# Patient Record
Sex: Female | Born: 2000 | Race: White | Hispanic: No | Marital: Single | Smoking: Never smoker
Health system: Southern US, Community
[De-identification: ages and names within clinical notes are randomized; demographics above are authoritative.]

## PROBLEM LIST (undated history)

## (undated) DIAGNOSIS — F32A Depression, unspecified: Secondary | ICD-10-CM

## (undated) DIAGNOSIS — F419 Anxiety disorder, unspecified: Secondary | ICD-10-CM

## (undated) DIAGNOSIS — F329 Major depressive disorder, single episode, unspecified: Secondary | ICD-10-CM

## (undated) DIAGNOSIS — G35 Multiple sclerosis: Secondary | ICD-10-CM

## (undated) DIAGNOSIS — G35D Multiple sclerosis, unspecified: Secondary | ICD-10-CM

## (undated) HISTORY — DX: Depression, unspecified: F32.A

## (undated) HISTORY — DX: Anxiety disorder, unspecified: F41.9

## (undated) HISTORY — DX: Multiple sclerosis, unspecified: G35.D

## (undated) HISTORY — DX: Multiple sclerosis: G35

---
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
## 2020-01-11 ENCOUNTER — Other Ambulatory Visit: Payer: Self-pay

## 2020-01-11 ENCOUNTER — Encounter: Payer: Self-pay | Admitting: Family Medicine

## 2020-01-11 ENCOUNTER — Ambulatory Visit: Payer: BC Managed Care – PPO | Admitting: Family Medicine

## 2020-01-11 VITALS — BP 133/88 | HR 84 | Temp 97.8°F | Ht 66.0 in | Wt 289.0 lb

## 2020-01-11 DIAGNOSIS — Z7289 Other problems related to lifestyle: Secondary | ICD-10-CM | POA: Insufficient documentation

## 2020-01-11 DIAGNOSIS — F329 Major depressive disorder, single episode, unspecified: Secondary | ICD-10-CM | POA: Diagnosis not present

## 2020-01-11 DIAGNOSIS — F411 Generalized anxiety disorder: Secondary | ICD-10-CM | POA: Diagnosis not present

## 2020-01-11 DIAGNOSIS — F32A Depression, unspecified: Secondary | ICD-10-CM

## 2020-01-11 DIAGNOSIS — F66 Other sexual disorders: Secondary | ICD-10-CM | POA: Diagnosis not present

## 2020-01-11 MED ORDER — BUSPIRONE HCL 10 MG PO TABS: 10.0000 mg | ORAL_TABLET | Freq: Two times a day (BID) | ORAL | 2 refills | Status: DC

## 2020-01-11 NOTE — Progress Notes (Signed)
Subjective: CC: f/u GAD PCP: Raliegh Ip, DO [id number] B Crist is a 19 y.o. female presenting to clinic today for:  1. GAD Patient here for 1 month follow-up on generalized anxiety disorder.  At last visit, which was a televisit, she had reported rare use of the Atarax.  She had abstained from cutting behaviors.  She had identified emotional eating as an issue.  While she wanted to seek counseling she was reluctant to because of her father not being a believer in mental health.  Wellbutrin was added at last visit and she notes that she did not really see a big difference with the Wellbutrin.  Certainly no exacerbation of the anxiety or panic but did not feel that this helped with emotional eating or depressive symptoms.  She continues to eat 1-2 meals per day but notes that the meals are not healthy.  She goes on to state that she has been very uncomfortable in her body for quite some time now and actually contemplated what it would be like to be a female instead.  She identifies as bisexual.  She has thought about having surgical reduction of her breasts.  She has mentioned the thought of being the mail to her father and brother, who are very conservative and have essentially dismissed her thoughts and feelings about this.  She feels isolated at home, particularly since she is not in school and only doing virtual learning at this time.  She does not feel that she can talk to her mother, who is also very conservative.  She has resumed self cutting behaviors, citing that she most recently used a blade from a pencil sharpener.  She is also self-induced vomiting since our last talk.  She would be interested in seeing a counselor but worries that her insurance will not cover it and it will be too costly.  This is not something that she can afford right now.  She has been compliant with the fluoxetine and the buspirone.  ROS: Per HPI  No Known Allergies Past Medical History:  Diagnosis Date    . Anxiety   . Depression     Current Outpatient Medications:  .  buPROPion (WELLBUTRIN XL) 150 MG 24 hr tablet, Take 1 tablet (150 mg total) by mouth daily., Disp: 30 tablet, Rfl: 2 .  busPIRone (BUSPAR) 7.5 MG tablet, Take 1 tablet (7.5 mg total) by mouth 2 (two) times daily., Disp: 180 tablet, Rfl: 1 .  FLUoxetine (PROZAC) 40 MG capsule, Take 1 capsule (40 mg total) by mouth daily., Disp: 90 capsule, Rfl: 0 .  hydrOXYzine (ATARAX/VISTARIL) 25 MG tablet, Take 0.5-1 tablets (12.5-25 mg total) by mouth every 8 (eight) hours as needed for anxiety., Disp: 30 tablet, Rfl: 0 .  levonorgestrel-ethinyl estradiol (SEASONALE) 0.15-0.03 MG tablet, Take 1 tablet by mouth daily., Disp: 1 Package, Rfl: 1 .  omeprazole (PRILOSEC) 20 MG capsule, Take 1 capsule (20 mg total) by mouth daily. (for acid reflux), Disp: 90 capsule, Rfl: 1 Social History   Socioeconomic History  . Marital status: Single    Spouse name: Not on file  . Number of children: Not on file  . Years of education: Not on file  . Highest education level: Not on file  Occupational History  . Not on file  Tobacco Use  . Smoking status: Never Smoker  . Smokeless tobacco: Never Used  Substance and Sexual Activity  . Alcohol use: Yes    Comment: occ  . Drug use: Never  .  Sexual activity: Not on file  Other Topics Concern  . Not on file  Social History Narrative   In early college.   Social Determinants of Health   Financial Resource Strain:   . Difficulty of Paying Living Expenses: Not on file  Food Insecurity:   . Worried About Charity fundraiser in the Last Year: Not on file  . Ran Out of Food in the Last Year: Not on file  Transportation Needs:   . Lack of Transportation (Medical): Not on file  . Lack of Transportation (Non-Medical): Not on file  Physical Activity:   . Days of Exercise per Week: Not on file  . Minutes of Exercise per Session: Not on file  Stress:   . Feeling of Stress : Not on file  Social  Connections:   . Frequency of Communication with Friends and Family: Not on file  . Frequency of Social Gatherings with Friends and Family: Not on file  . Attends Religious Services: Not on file  . Active Member of Clubs or Organizations: Not on file  . Attends Archivist Meetings: Not on file  . Marital Status: Not on file  Intimate Partner Violence:   . Fear of Current or Ex-Partner: Not on file  . Emotionally Abused: Not on file  . Physically Abused: Not on file  . Sexually Abused: Not on file   Family History  Problem Relation Age of Onset  . Hypertension Father   . Hypothyroidism Father   . Vitamin D deficiency Father   . Bipolar disorder Maternal Uncle   . Bipolar disorder Maternal Grandfather   . Depression Paternal Grandmother     Objective: Office vital signs reviewed. BP 133/88   Pulse 84   Temp 97.8 F (36.6 C) (Temporal)   Ht 5\' 6"  (1.676 m)   Wt 289 lb (131.1 kg)   LMP 01/10/2020 (Exact Date)   SpO2 97%   BMI 46.65 kg/m   Physical Examination:  General: Awake, alert, No acute distress Psych: Mood slightly depressed.  Eye contact fair.  Patient does not appear to be responding to internal stimuli.  Speech is normal.  Thought process linear. GAD 7 : Generalized Anxiety Score 01/11/2020 11/22/2019 10/25/2019 02/24/2019  Nervous, Anxious, on Edge 2 1 3 1   Control/stop worrying 1 1 2  0  Worry too much - different things 1 2 2  0  Trouble relaxing 2 3 3 1   Restless 2 3 3 2   Easily annoyed or irritable 2 2 3 2   Afraid - awful might happen 1 0 1 1  Total GAD 7 Score 11 12 17 7   Anxiety Difficulty Very difficult Somewhat difficult Very difficult Somewhat difficult    Depression screen [facility] 2/9 01/11/2020 11/22/2019 10/25/2019  Decreased Interest 2 1 3   Down, Depressed, Hopeless 2 2 2   PHQ - 2 Score 4 3 5   Altered sleeping 2 3 3   Tired, decreased energy 3 3 3   Change in appetite 0 1 3  Feeling bad or failure about yourself  2 2 3   Trouble concentrating 2  2 2   Moving slowly or fidgety/restless 0 3 0  Suicidal thoughts 1 1 1   PHQ-9 Score 14 18 20   Difficult doing work/chores Very difficult Somewhat difficult Very difficult   Assessment/ Plan: 19 y.o. female   1. Generalized anxiety disorder Continues to be uncontrolled.  I have increased her buspirone to 10 mg twice daily.  We have discussed 3 times daily dosing but because she  sleeps late she did not feel that she could comply with 3 times daily dosing.  I would like to discuss how things are going in about 4 weeks.  She will make an appointment accordingly.  2. Depressive disorder Continue the Prozac.  We will plan to wean off of the Wellbutrin.  Instructions for weaning discussed with patient.  3. Deliberate self-cutting I am going to reach out to virtual behavioral health to see if they can contact [place].  I will also reach out to Dr. Denny Levy who subspecializes in gender/LGBTQ medicine.  Hoping that she will have some recommendations as far as support groups and/or other resources for this patient, as I do think that her limited support at home impacts her mental health quite a bit.  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  No orders of the defined types were placed in this encounter.  Raliegh Ip, DO [facility] [phone]

## 2020-01-11 NOTE — Patient Instructions (Signed)
Start taking the bupropion every other day for 1 week then you can stop.  I am increasing the Buspar to 10mg  BID.

## 2020-01-12 ENCOUNTER — Telehealth: Payer: Self-pay | Admitting: Clinical

## 2020-01-12 NOTE — Telephone Encounter (Signed)
Referral received from Dr. Nadine Counts for [facility].  TC to [facility], no answer. This Behavioral Health Clinician left a message to call back with name & contact information.

## 2020-01-17 ENCOUNTER — Encounter: Payer: Self-pay | Admitting: Clinical

## 2020-01-17 ENCOUNTER — Telehealth: Payer: Self-pay | Admitting: Clinical

## 2020-01-17 NOTE — Telephone Encounter (Signed)
Sent a secure email to [name] to offer [employer] via email in chart stating the following:  Dear [name], I am part of the team that works with Dr. Nadine Counts to offer you additional support.  Our [employer] team provides information, strategies and resources for people over the phone. If you are interested in our services, you can contact us directly at [phone].  You may leave a message on that phone since it is a confidential voicemail.  Thank you for your time and we hope to hear from you soon.   Ernest Haber [employer]

## 2020-02-08 ENCOUNTER — Encounter: Payer: Self-pay | Admitting: Family Medicine

## 2020-02-12 ENCOUNTER — Ambulatory Visit: Payer: BC Managed Care – PPO | Admitting: Family Medicine

## 2020-03-26 ENCOUNTER — Ambulatory Visit (INDEPENDENT_AMBULATORY_CARE_PROVIDER_SITE_OTHER): Payer: BC Managed Care – PPO | Admitting: Family Medicine

## 2020-03-26 DIAGNOSIS — F411 Generalized anxiety disorder: Secondary | ICD-10-CM | POA: Diagnosis not present

## 2020-03-26 DIAGNOSIS — F329 Major depressive disorder, single episode, unspecified: Secondary | ICD-10-CM | POA: Diagnosis not present

## 2020-03-26 DIAGNOSIS — Z7289 Other problems related to lifestyle: Secondary | ICD-10-CM | POA: Diagnosis not present

## 2020-03-26 DIAGNOSIS — Z30013 Encounter for initial prescription of injectable contraceptive: Secondary | ICD-10-CM

## 2020-03-26 DIAGNOSIS — F32A Depression, unspecified: Secondary | ICD-10-CM

## 2020-03-26 DIAGNOSIS — F66 Other sexual disorders: Secondary | ICD-10-CM

## 2020-03-26 DIAGNOSIS — F4010 Social phobia, unspecified: Secondary | ICD-10-CM

## 2020-03-26 MED ORDER — MEDROXYPROGESTERONE ACETATE 150 MG/ML IM SUSP: 150.0000 mg | INTRAMUSCULAR | 1 refills | Status: DC

## 2020-03-26 NOTE — Progress Notes (Signed)
Telephone visit  Subjective: CC: Follow-up anxiety disorder PCP: Janora Norlander, DO Debra Copeland is a 19 y.o. female calls for telephone consult today. Patient provides verbal consent for consult held via phone.  Due to COVID-19 pandemic this visit was conducted virtually. This visit type was conducted due to national recommendations for restrictions regarding the COVID-19 Pandemic (e.g. social distancing, sheltering in place) in an effort to limit this patient's exposure and mitigate transmission in our community. All issues noted in this document were discussed and addressed.  A physical exam was not performed with this format.   Location of patient: Home Location of provider: [facility] Others present for call: None  1.  Generalized anxiety disorder At last visit, patient had noted that she was interested in seeing a counselor but was worried about affordability.  It appears that virtual behavioral health attempted to contact her couple of times but unfortunately were unable to connect.  She goes on to state that she really wants to try and taper off of all of her medications as she is not sure that it is super effective and really wants to rely on counseling services.  She wants to see if perhaps she can get someone that would be able to see her face-to-face that she does not feel comfortable on the phone discussing her personal matters.  She worries about a family member over hearing her.  To summarize, her last visit, she identified as bisexual.  She often contemplated what it would like to be a female instead of female.  She feels uncomfortable in her body and has been uncomfortable for quite some time.  She has mentioned her thoughts and feelings to her father and brother, who she identifies as very conservative and feels that she is essentially dismissed.  She has expressed feelings of isolation and does not feel that she can connect with her mother who she also notes is very  conservative.  She has had cutting behaviors in the past.  She denies any SI or HI today.  Additionally, she notes that she discontinued all of her medications about 2 days ago.  The only thing that she is taking currently is her birth control pill.  Despite having been informed that Depo-Provera can cause weight gain she would like to go ahead and switch over to Depo from her pill as sometimes she does forget to take her pill.  Last menses was about 3 weeks ago.  Not sexually active.   ROS: Per HPI  No Known Allergies Past Medical History:  Diagnosis Date  . Anxiety   . Depression     Current Outpatient Medications:  .  levonorgestrel-ethinyl estradiol (SEASONALE) 0.15-0.03 MG tablet, Take 1 tablet by mouth daily., Disp: 1 Package, Rfl: 1 .  omeprazole (PRILOSEC) 20 MG capsule, Take 1 capsule (20 mg total) by mouth daily. (for acid reflux), Disp: 90 capsule, Rfl: 1  Assessment/ Plan: 19 y.o. female   1. Generalized anxiety disorder Not controlled but does not wish to continue medications.  Discussed how to taper off of the Prozac.  She is already discontinued the other medications. - Ambulatory referral to Psychology  2. Social phobia - Ambulatory referral to Psychology  3. Depressive disorder - Ambulatory referral to Psychology  4. Deliberate self-cutting No SI or HI - Ambulatory referral to Psychology  5. 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  May need to ultimately see a psychiatrist as well but for now we will  at least try to get her into therapy - Ambulatory referral to Psychology  6. Encounter for initial prescription of injectable contraceptive She will bring in Depo-Provera to have this administered.  We discussed she will likely need urine pregnancy. - medroxyPROGESTERone (DEPO-PROVERA) 150 MG/ML injection; Inject 1 mL (150 mg total) into the muscle every 3 (three) months.  Dispense: 1 mL; Refill: 1    Start time:  4:14pm End time: 4:24pm  Total time spent on patient care (including telephone call/ virtual visit): 18 minutes  Josean Lycan Hulen Skains, DO [facility] [phone]

## 2020-03-27 ENCOUNTER — Encounter: Payer: Self-pay | Admitting: Family Medicine

## 2020-03-29 ENCOUNTER — Other Ambulatory Visit: Payer: Self-pay | Admitting: Family Medicine

## 2020-03-29 ENCOUNTER — Telehealth: Payer: Self-pay | Admitting: Family Medicine

## 2020-04-30 ENCOUNTER — Encounter: Payer: Self-pay | Admitting: Family Medicine

## 2020-05-15 ENCOUNTER — Telehealth (HOSPITAL_COMMUNITY): Payer: Self-pay | Admitting: Licensed Clinical Social Worker

## 2020-05-15 DIAGNOSIS — F32A Depression, unspecified: Secondary | ICD-10-CM

## 2020-05-15 DIAGNOSIS — F411 Generalized anxiety disorder: Secondary | ICD-10-CM

## 2020-05-15 NOTE — Telephone Encounter (Signed)
Left message encouraging contact 

## 2020-05-16 ENCOUNTER — Telehealth (HOSPITAL_COMMUNITY): Payer: Self-pay | Admitting: Licensed Clinical Social Worker

## 2020-05-16 DIAGNOSIS — F32A Depression, unspecified: Secondary | ICD-10-CM

## 2020-05-16 NOTE — Telephone Encounter (Signed)
Left message encouraging contact 

## 2020-05-20 ENCOUNTER — Telehealth (INDEPENDENT_AMBULATORY_CARE_PROVIDER_SITE_OTHER): Payer: BC Managed Care – PPO | Admitting: Licensed Clinical Social Worker

## 2020-05-20 DIAGNOSIS — F411 Generalized anxiety disorder: Secondary | ICD-10-CM

## 2020-05-20 NOTE — Telephone Encounter (Signed)
Left message encouraging contact 

## 2020-05-21 ENCOUNTER — Telehealth: Payer: Self-pay | Admitting: Licensed Clinical Social Worker

## 2020-05-21 NOTE — Telephone Encounter (Signed)
Keaja contacted [facility] line on 05/20/20 at 542 pm.  Writer called her back on this day and left a voicemail message encouraging contact.

## 2020-05-22 ENCOUNTER — Telehealth: Payer: Self-pay | Admitting: Licensed Clinical Social Worker

## 2020-05-22 DIAGNOSIS — F32A Depression, unspecified: Secondary | ICD-10-CM

## 2020-05-22 DIAGNOSIS — F411 Generalized anxiety disorder: Secondary | ICD-10-CM

## 2020-05-22 NOTE — BH Specialist Note (Signed)
Virtual Behavioral Health Treatment Plan Team Note  MRN: [id number] NAME: Debra Copeland  DATE: 05/22/20  Start time:  210p End time:  225p Total time:   Total number of Virtual BH Treatment Team Plan encounters: 1/4  Treatment Team Attendees: Nolon Rod, LCSW; Dr. Vanetta Shawl, Psychiatrist  Diagnoses:    ICD-10-CM   1. Generalized anxiety disorder  F41.1   2. Depressive disorder  F32.9     Goals, Interventions and Follow-up Plan Goals: Increase healthy adjustment to current life circumstances Interventions: Brief CBT Medication Management Recommendations: Mirtazipine 7.5mg  at night for 1 week; then 15 mg at night to target depression, insomnia and appetite loss.  If TSH is not checked within a year consider checking to rule out medical cause contributing to her symptoms. Follow-up Plan: one week follow up  History of the present illness Presenting Problem/Current Symptoms: Patient currently has symptoms of depression and anxiety (hopelessness, anxious, on edge, lack of motivation).  Psychiatric History  Depression: Yes Anxiety: Yes Mania: No Psychosis: No PTSD symptoms: No  Past Psychiatric History/Hospitalization(s): Hospitalization for psychiatric illness: No Prior Suicide Attempts: No Prior Self-injurious behavior: No  Psychosocial stressors school  Self-harm Behaviors Risk Assessment n/a   Screenings PHQ-9 Assessments:  Depression screen [facility] 2/9 05/22/2020 01/11/2020 11/22/2019  Decreased Interest 2 2 1   Down, Depressed, Hopeless 2 2 2   PHQ - 2 Score 4 4 3   Altered sleeping 2 2 3   Tired, decreased energy 2 3 3   Change in appetite 2 0 1  Feeling bad or failure about yourself  2 2 2   Trouble concentrating 1 2 2   Moving slowly or fidgety/restless 1 0 3  Suicidal thoughts 0 1 1  PHQ-9 Score 14 14 18   Difficult doing work/chores Very difficult Very difficult Somewhat difficult   GAD-7 Assessments:  GAD 7 : Generalized Anxiety Score 05/22/2020  01/11/2020 11/22/2019 10/25/2019  Nervous, Anxious, on Edge 2 2 1 3   Control/stop worrying 2 1 1 2   Worry too much - different things 2 1 2 2   Trouble relaxing 1 2 3 3   Restless 1 2 3 3   Easily annoyed or irritable 3 2 2 3   Afraid - awful might happen 2 1 0 1  Total GAD 7 Score 13 11 12 17   Anxiety Difficulty Very difficult Very difficult Somewhat difficult Very difficult    Past Medical History Past Medical History:  Diagnosis Date   Anxiety    Depression     Vital signs: There were no vitals filed for this visit.  Allergies:  Allergies as of 05/22/2020   (No Known Allergies)    Medication History Current medications:  Outpatient Encounter Medications as of 05/22/2020  Medication Sig   medroxyPROGESTERone (DEPO-PROVERA) 150 MG/ML injection Inject 1 mL (150 mg total) into the muscle every 3 (three) months.   omeprazole (PRILOSEC) 20 MG capsule TAKE 1 CAPSULE BY MOUTH ONCE DAILY FOR ACID REFLUX   No facility-administered encounter medications on file as of 05/22/2020.     Scribe for Treatment Team: , LCSW

## 2020-05-22 NOTE — Progress Notes (Signed)
[facility] ([facility]) Psychiatric Consultant Case Review   Debra Copeland is a 19 y.o. year old female [employer] student with a history of anxiety. She has depressive symptoms with insomnia, appetite loss, and social anxiety in the context of non adherence to medication for the past two months due to perceived side effect of emotional numbness. She lives with her parents, and reports conflict with them. She has history of SIB of cutting in the past according to the chart review.   Assessment/Provisional Diagnosis # MDD # GAD Consider starting mirtazapine to target depression, anxiety, insomnia, appetite loss.   Recommendation   Start mirtazapine 7.5 mg at night for one week, then 15 mg at night  - BH specialist to contact weekly, work on exposure therapy  Thank you for your consult. We will continue to follow the patient. Please contact [facility]  for any questions or concerns.   The above treatment considerations and suggestions are based on consultation with the [facility] specialist and/or PCP and a review of information available in the shared registry and the patient's Electronic Health Record (EHR). I have not personally examined the patient. All recommendations should be implemented with consideration of the patient's relevant prior history and current clinical status. Please feel free to call me with any questions about the care of this patient.

## 2020-05-22 NOTE — BH Specialist Note (Signed)
[facility] Initial Clinical Assessment  MRN: [id number] NAME: Debra Copeland Date: 05/21/20  Start time:  11a End time:  1130a Total time:  Call number:  [phone]  Type of Contact:  telephone Patient consent obtained:  yes Reason for Visit today:  begin [facility] services  Treatment History Patient recently received Inpatient Treatment:    Facility/Program:  no  Date of discharge:   Patient currently being seen by therapist/psychiatrist:   Patient currently receiving the following services:    Past Psychiatric History/Hospitalization(s): Anxiety: Yes, began therapy while in middle school; has taken medication in the past Bipolar Disorder: No Depression: Yes; began therapy while in middle school; has taken medication in the past Mania: No Psychosis: No Schizophrenia: No Personality Disorder: No Hospitalization for psychiatric illness: No History of Electroconvulsive Shock Therapy: No Prior Suicide Attempts: No  Clinical Assessment:  PHQ-9 Assessments: Depression screen [facility] 2/9 05/22/2020 01/11/2020 11/22/2019  Decreased Interest 2 2 1   Down, Depressed, Hopeless 2 2 2   PHQ - 2 Score 4 4 3   Altered sleeping 2 2 3   Tired, decreased energy 2 3 3   Change in appetite 2 0 1  Feeling bad or failure about yourself  2 2 2   Trouble concentrating 1 2 2   Moving slowly or fidgety/restless 1 0 3  Suicidal thoughts 0 1 1  PHQ-9 Score 14 14 18   Difficult doing work/chores Very difficult Very difficult Somewhat difficult    GAD-7 Assessments: GAD 7 : Generalized Anxiety Score 05/22/2020 01/11/2020 11/22/2019 10/25/2019  Nervous, Anxious, on Edge 2 2 1 3   Control/stop worrying 2 1 1 2   Worry too much - different things 2 1 2 2   Trouble relaxing 1 2 3 3   Restless 1 2 3 3   Easily annoyed or irritable 3 2 2 3   Afraid - awful might happen 2 1 0 1  Total GAD 7 Score 13 11 12 17   Anxiety Difficulty Very difficult Very difficult Somewhat difficult Very difficult      Social Functioning Social maturity:  WNL Social judgement:  WNL  Stress Current stressors:  school, managing emotions Familial stressors:  WNL Sleep:  poor Appetite:  poor Coping ability:overwhelmed   Patient taking medications as prescribed:  currently reports that she has not taken medication in about 2 months  Current medications:  Outpatient Encounter Medications as of 05/22/2020  Medication Sig  . medroxyPROGESTERone (DEPO-PROVERA) 150 MG/ML injection Inject 1 mL (150 mg total) into the muscle every 3 (three) months.  omeprazole (PRILOSEC) 20 MG capsule TAKE 1 CAPSULE BY MOUTH ONCE DAILY FOR ACID REFLUX   No facility-administered encounter medications on file as of 05/22/2020.    Self-harm Behaviors Risk Assessment Self-harm risk factors:   Patient endorses recent thoughts of harming self:    Suicide Severity Rating Scale: No flowsheet data found.  Danger to Others Risk Assessment Danger to others risk factors:  n/a Patient endorses recent thoughts of harming others:    Dynamic Appraisal of Situational Aggression (DASA): No flowsheet data found.  Substance Use Assessment Patient recently consumed alcohol:  n/a  Alcohol Use Disorder Identification Test (AUDIT): No flowsheet data found. Patient recently used drugs:    Opioid Risk Assessment:  Patient is concerned about dependence or abuse of substances:    ASAM Multidimensional Assessment Summary:  Dimension 1:    Dimension 1 Rating:    Dimension 2:    Dimension 2 Rating:    Dimension 3:    Dimension 3  Rating:    Dimension 4:    Dimension 4 Rating:    Dimension 5:    Dimension 5 Rating:    Dimension 6:    Dimension 6 Rating:   ASAM's Severity Rating Score:   ASAM Recommended Level of Treatment:     Goals, Interventions and Follow-up Plan Goals: Increase healthy adjustment to current life circumstances Interventions: Brief CBT Follow-up Plan: one week follow up  Summary of Clinical  Assessment Summary: Debra Copeland is a 19 year old woman that lives in [place] with her parents.  She is currently in college [employer]), second year.  She is currently undecided about her major. Debra Copeland reports a "normal" relationship with her mother. She reports that she has struggled with her emotions since middle school.  She has participated in therapy but states no improvement since her mother sat in on the sessions.  She reports "I could not talk freely."  She wants assistance with managing her emotions (anger and sadness).  She reports that she will become emotional with her sibling, friends or mother and the intensity of the emotion will increase and she stays upset for more than 1 day. She reports being overwhelmed, lacks motivation, hopelessness, poor sleep and appetite.  She feels on edge and worried most of the time.   Debra Elk, LCSW

## 2020-05-27 ENCOUNTER — Ambulatory Visit (INDEPENDENT_AMBULATORY_CARE_PROVIDER_SITE_OTHER): Payer: BC Managed Care – PPO | Admitting: *Deleted

## 2020-05-27 ENCOUNTER — Other Ambulatory Visit: Payer: Self-pay

## 2020-05-27 DIAGNOSIS — Z3041 Encounter for surveillance of contraceptive pills: Secondary | ICD-10-CM

## 2020-05-27 DIAGNOSIS — Z3042 Encounter for surveillance of injectable contraceptive: Secondary | ICD-10-CM | POA: Diagnosis not present

## 2020-05-27 LAB — PREGNANCY, URINE: Preg Test, Ur: NEGATIVE

## 2020-05-27 MED ORDER — MEDROXYPROGESTERONE ACETATE 150 MG/ML IM SUSP
150.0000 mg | INTRAMUSCULAR | Status: AC
  Administered 2020-05-27 – 2021-04-18 (×4): 150 mg via INTRAMUSCULAR

## 2020-06-12 ENCOUNTER — Telehealth (INDEPENDENT_AMBULATORY_CARE_PROVIDER_SITE_OTHER): Payer: BC Managed Care – PPO | Admitting: Licensed Clinical Social Worker

## 2020-06-12 ENCOUNTER — Telehealth: Payer: Self-pay | Admitting: Licensed Clinical Social Worker

## 2020-06-12 DIAGNOSIS — F32A Depression, unspecified: Secondary | ICD-10-CM

## 2020-06-12 DIAGNOSIS — F411 Generalized anxiety disorder: Secondary | ICD-10-CM

## 2020-06-12 DIAGNOSIS — F4010 Social phobia, unspecified: Secondary | ICD-10-CM

## 2020-06-12 DIAGNOSIS — F329 Major depressive disorder, single episode, unspecified: Secondary | ICD-10-CM

## 2020-06-12 NOTE — BH Specialist Note (Signed)
Virtual Behavioral Health Treatment Plan Team Note  MRN: [id number] NAME: Debra Copeland  DATE: 06/18/20  Start time:  115p End time:  125p Total time:  10 min  Total number of Virtual BH Treatment Team Plan encounters: 2/4  Treatment Team Attendees: Dr. Vanetta Shawl, Psychiatrist; Nolon Rod, LCSW  Diagnoses:    ICD-10-CM   1. Generalized anxiety disorder  F41.1   2. Depressive disorder  F32.9   3. Social phobia  F40.10     Goals, Interventions and Follow-up Plan Goals: Increase healthy adjustment to current life circumstances Increase adequate support systems for patient/family Interventions: Brief CBT Medication Management Recommendations: Start mirtazapine 7.5 mg at night for one week then 15mg  at night Follow-up Plan: Continue with [facility] services  History of the present illness Presenting Problem/Current Symptoms: Patient reports that she has experienced depression and anxiety since middle school age.  Reports that she worries often, feels on edge, poor motivation, cycling sleep habits  Psychiatric History  Depression: Yes Anxiety: Yes Mania: No Psychosis: No PTSD symptoms: No  Past Psychiatric History/Hospitalization(s): Hospitalization for psychiatric illness: No Prior Suicide Attempts: No Prior Self-injurious behavior: No  Psychosocial stressors being overwhelmed  Self-harm Behaviors Risk Assessment n/a  Screenings PHQ-9 Assessments:  Depression screen [facility] 2/9 06/12/2020 05/22/2020 01/11/2020  Decreased Interest 2 2 2   Down, Depressed, Hopeless 2 2 2   PHQ - 2 Score 4 4 4   Altered sleeping 1 2 2   Tired, decreased energy 2 2 3   Change in appetite 1 2 0  Feeling bad or failure about yourself  1 2 2   Trouble concentrating 2 1 2   Moving slowly or fidgety/restless 1 1 0  Suicidal thoughts 0 0 1  PHQ-9 Score 12 14 14   Difficult doing work/chores Somewhat difficult Very difficult Very difficult  Some recent data might be hidden   GAD-7 Assessments:  GAD 7  : Generalized Anxiety Score 06/12/2020 05/22/2020 01/11/2020 11/22/2019  Nervous, Anxious, on Edge 2 2 2 1   Control/stop worrying 2 2 1 1   Worry too much - different things 2 2 1 2   Trouble relaxing 2 1 2 3   Restless 2 1 2 3   Easily annoyed or irritable 2 3 2 2   Afraid - awful might happen 2 2 1  0  Total GAD 7 Score 14 13 11 12   Anxiety Difficulty Somewhat difficult Very difficult Very difficult Somewhat difficult    Past Medical History Past Medical History:  Diagnosis Date   Anxiety    Depression     Vital signs: There were no vitals filed for this visit.  Allergies:  Allergies as of 06/12/2020   (No Known Allergies)    Medication History Current medications:  Outpatient Encounter Medications as of 06/12/2020  Medication Sig   medroxyPROGESTERone (DEPO-PROVERA) 150 MG/ML injection Inject 1 mL (150 mg total) into the muscle every 3 (three) months.   omeprazole (PRILOSEC) 20 MG capsule TAKE 1 CAPSULE BY MOUTH ONCE DAILY FOR ACID REFLUX   Facility-Administered Encounter Medications as of 06/12/2020  Medication   medroxyPROGESTERone (DEPO-PROVERA) injection 150 mg     Scribe for Treatment Team: , LCSW

## 2020-06-12 NOTE — BH Specialist Note (Signed)
[place] Virtual BH Telephone Follow-up  MRN: [id number] NAME: BREXLEY CUTSHAW Date: 06/07/20  Start time:  11a End time:  1130a Total time:  Call number:  [phone]  Reason for call today:  follow up  PHQ-9 Scores:  Depression screen [facility] 2/9 05/22/2020 01/11/2020 11/22/2019 10/25/2019 02/24/2019  Decreased Interest 2 2 1 3 2   Down, Depressed, Hopeless 2 2 2 2 1   PHQ - 2 Score 4 4 3 5 3   Altered sleeping 2 2 3 3 1   Tired, decreased energy 2 3 3 3 2   Change in appetite 2 0 1 3 2   Feeling bad or failure about yourself  2 2 2 3 1   Trouble concentrating 1 2 2 2 2   Moving slowly or fidgety/restless 1 0 3 0 0  Suicidal thoughts 0 1 1 1  0  PHQ-9 Score 14 14 18 20 11   Difficult doing work/chores Very difficult Very difficult Somewhat difficult Very difficult Somewhat difficult   GAD-7 Scores:  GAD 7 : Generalized Anxiety Score 06/12/2020 05/22/2020 01/11/2020 11/22/2019  Nervous, Anxious, on Edge 2 2 2 1   Control/stop worrying 2 2 1 1   Worry too much - different things 2 2 1 2   Trouble relaxing 2 1 2 3   Restless 2 1 2 3   Easily annoyed or irritable 2 3 2 2   Afraid - awful might happen 2 2 1  0  Total GAD 7 Score 14 13 11 12   Anxiety Difficulty Somewhat difficult Very difficult Very difficult Somewhat difficult    Stress Current stressors:  school Sleep:  "not enough" Appetite:  normal; "I have never really eaten 3 times per day" Coping ability:  overwhelmed Patient taking medications as prescribed:  wants to start back taking medication  Current medications:  Outpatient Encounter Medications as of 06/12/2020  Medication Sig   medroxyPROGESTERone (DEPO-PROVERA) 150 MG/ML injection Inject 1 mL (150 mg total) into the muscle every 3 (three) months.   omeprazole (PRILOSEC) 20 MG capsule TAKE 1 CAPSULE BY MOUTH ONCE DAILY FOR ACID REFLUX   Facility-Administered Encounter Medications as of 06/12/2020  Medication   medroxyPROGESTERone (DEPO-PROVERA) injection 150 mg      Self-harm Behaviors Risk Assessment Self-harm risk factors:  none Patient endorses recent thoughts of harming self:    Suicide Severity Rating Scale: No flowsheet data found. No flowsheet data found.   Danger to Others Risk Assessment Danger to others risk factors:  none Patient endorses recent thoughts of harming others:    Dynamic Appraisal of Situational Aggression (DASA): No flowsheet data found.   Substance Use Assessment Patient recently consumed alcohol:  none  Alcohol Use Disorder Identification Test (AUDIT): No flowsheet data found. Patient recently used drugs:    Opioid Risk Assessment:    Goals, Interventions and Follow-up Plan Goals: Increase healthy adjustment to current life circumstances and Increase adequate support systems for patient/family Interventions: Brief CBT Follow-up Plan: Continue with [facility] services  Summary: Klyn was open and interacted during the session.  She reports that she is ready to attend school full time and she wants to live on campus.  Her parents due to finances and previous behavior prefer Patient to live at home and commute.  Ayse reports a loving, supportive relationship with her father.  She would like to improve her relationship with her mother.  Selyna reports years of depression and anxiety.  She was able to list coping skills used mainly distractions to reduce symptoms.  Discussion of mindfulness and relaxation techniques.  Marinda Elk, LCSW

## 2020-08-21 ENCOUNTER — Other Ambulatory Visit: Payer: Self-pay

## 2020-08-21 ENCOUNTER — Encounter: Payer: Self-pay | Admitting: Family Medicine

## 2020-08-21 ENCOUNTER — Ambulatory Visit (INDEPENDENT_AMBULATORY_CARE_PROVIDER_SITE_OTHER): Payer: BC Managed Care – PPO | Admitting: *Deleted

## 2020-08-21 DIAGNOSIS — Z3042 Encounter for surveillance of injectable contraceptive: Secondary | ICD-10-CM | POA: Diagnosis not present

## 2020-08-21 DIAGNOSIS — Z3041 Encounter for surveillance of contraceptive pills: Secondary | ICD-10-CM

## 2020-08-21 NOTE — Progress Notes (Signed)
Depo-Provera given and patient tolerated well. Patient due for next one 12/9-01/12.

## 2020-08-22 MED ORDER — OMEPRAZOLE 20 MG PO CPDR: DELAYED_RELEASE_CAPSULE | ORAL | 0 refills | Status: DC

## 2020-09-30 ENCOUNTER — Other Ambulatory Visit: Payer: Self-pay | Admitting: Family Medicine

## 2020-10-16 ENCOUNTER — Encounter: Payer: Self-pay | Admitting: Family Medicine

## 2020-11-04 ENCOUNTER — Encounter: Payer: Self-pay | Admitting: Family Medicine

## 2020-11-04 ENCOUNTER — Other Ambulatory Visit: Payer: Self-pay

## 2020-11-04 ENCOUNTER — Ambulatory Visit: Payer: BC Managed Care – PPO | Admitting: Family Medicine

## 2020-11-04 VITALS — BP 137/98 | HR 114 | Temp 98.2°F | Ht 66.0 in | Wt 313.0 lb

## 2020-11-04 DIAGNOSIS — K219 Gastro-esophageal reflux disease without esophagitis: Secondary | ICD-10-CM

## 2020-11-04 DIAGNOSIS — R030 Elevated blood-pressure reading, without diagnosis of hypertension: Secondary | ICD-10-CM | POA: Diagnosis not present

## 2020-11-04 DIAGNOSIS — F419 Anxiety disorder, unspecified: Secondary | ICD-10-CM

## 2020-11-04 DIAGNOSIS — F32A Depression, unspecified: Secondary | ICD-10-CM

## 2020-11-04 DIAGNOSIS — R41840 Attention and concentration deficit: Secondary | ICD-10-CM

## 2020-11-04 DIAGNOSIS — Z3042 Encounter for surveillance of injectable contraceptive: Secondary | ICD-10-CM | POA: Diagnosis not present

## 2020-11-04 DIAGNOSIS — F4010 Social phobia, unspecified: Secondary | ICD-10-CM

## 2020-11-04 DIAGNOSIS — F329 Major depressive disorder, single episode, unspecified: Secondary | ICD-10-CM

## 2020-11-04 DIAGNOSIS — K529 Noninfective gastroenteritis and colitis, unspecified: Secondary | ICD-10-CM

## 2020-11-04 LAB — BAYER DCA HB A1C WAIVED: HB A1C (BAYER DCA - WAIVED): 5 % (ref <7.0)

## 2020-11-04 MED ORDER — MEDROXYPROGESTERONE ACETATE 150 MG/ML IM SUSP: 150.0000 mg | INTRAMUSCULAR | 1 refills | Status: DC

## 2020-11-04 MED ORDER — OMEPRAZOLE 20 MG PO CPDR: DELAYED_RELEASE_CAPSULE | ORAL | 3 refills | Status: DC

## 2020-11-04 MED ORDER — BUPROPION HCL ER (XL) 150 MG PO TB24: 150.0000 mg | ORAL_TABLET | Freq: Every day | ORAL | 0 refills | Status: DC

## 2020-11-04 NOTE — Progress Notes (Signed)
Subjective: CC: GERD PCP: Janora Norlander, DO PQZ:RAQTMAU B Hergert is a 19 y.o. female presenting to clinic today for:  1. GERD She reports GERD is uncontrolled since lapse in PPI.  She reports 1 episode of vomiting due to GERD.  No hematemesis.  No ongoing nausea or vomiting.  Asking for refills on PPI  2.  Diarrhea Patient reports chronic in over 1 year.  No hematochezia or melena.  She has to go to the bathroom after every meal and reports loose stools.  Not currently taking any probiotics but has been using Imodium and Pepto-Bismol intermittently.  No known infectious etiology.  No unplanned weight loss.  She reports intermittent abdominal cramping associated with the diarrhea.  She has been trying to avoid lactose as this seems to be a trigger.  It does seem to help not eating the lactose but she "loves cheese".    3.  Inattention/ GAD/ depression Patient reports that she is had difficulty with attention and thought that may be she need to be evaluated for ADHD.  No formal diagnosis previously.  She reports ongoing anxiety.  She reports poor focus, poor motivation, fidgeting.  She never sought psychiatric evaluation as previously recommended.  She had been previously excepted by [facility] but decided not to go through with the referral due to finances and lack of transportation.  Apparently her father still has a stigma as well with psychiatric evaluation.  She reports excessive feelings of guilt.  She has had self-mutilation behaviors in the past.  4.  Morbid obesity, elevated blood pressure reading Patient does not follow a strict diet. She admits to not much physical activity.  She reports chronic fatigue despite adequate hours of sleep.  No chest pain or shortness of breath.  No edema.  No dizziness or falls.  ROS: Per HPI  No Known Allergies Past Medical History:  Diagnosis Date   Anxiety    Depression     Current Outpatient Medications:     medroxyPROGESTERone (DEPO-PROVERA) 150 MG/ML injection, Inject 1 mL (150 mg total) into the muscle every 3 (three) months., Disp: 1 mL, Rfl: 1   omeprazole (PRILOSEC) 20 MG capsule, TAKE 1 CAPSULE BY MOUTH ONCE DAILY FOR ACID REFLUX, Disp: 30 capsule, Rfl: 0  Current Facility-Administered Medications:    medroxyPROGESTERone (DEPO-PROVERA) injection 150 mg, 150 mg, Intramuscular, Q90 days, Naiyah Klostermann M, DO, 150 mg at 08/21/20 1537 Social History   Socioeconomic History   Marital status: Single    Spouse name: Not on file   Number of children: Not on file   Years of education: Not on file   Highest education level: Not on file  Occupational History   Not on file  Tobacco Use   Smoking status: Never Smoker   Smokeless tobacco: Never Used  Vaping Use   Vaping Use: Never used  Substance and Sexual Activity   Alcohol use: Yes    Comment: occ   Drug use: Never   Sexual activity: Not on file  Other Topics Concern   Not on file  Social History Narrative   In early college.   Social Determinants of Health   Financial Resource Strain: Not on file  Food Insecurity: Not on file  Transportation Needs: Not on file  Physical Activity: Not on file  Stress: Not on file  Social Connections: Not on file  Intimate Partner Violence: Not on file   Family History  Problem Relation Age of Onset   Hypertension  Father    Hypothyroidism Father    Vitamin D deficiency Father    Bipolar disorder Maternal Uncle    Bipolar disorder Maternal Grandfather    Depression Paternal Grandmother     Objective: Office vital signs reviewed. BP (!) 137/98    Pulse (!) 114    Temp 98.2 F (36.8 C) (Temporal)    Ht 5' 6" (1.676 m)    Wt (!) 313 lb (142 kg)    SpO2 99%    BMI 50.52 kg/m   Physical Examination:  General: Awake, alert, morbidly obese, No acute distress HEENT: Normal; clear white.  No exophthalmos.  No goiter Cardio: regular rate and rhythm, S1S2 heard, no murmurs  appreciated Pulm: clear to auscultation bilaterally, no wheezes, rhonchi or rales; normal work of breathing on room air GI: soft, non-tender, non-distended, bowel sounds present x4, no hepatomegaly, no splenomegaly, no masses MSK wide-based gait Psych: Mood is stable.  Eye contact is fair.  She is very fidgety.  Speech is somewhat pressured.  Depression screen [facility] 2/9 11/04/2020 06/12/2020 05/22/2020  Decreased Interest 0 2 2  Down, Depressed, Hopeless 0 2 2  PHQ - 2 Score 0 4 4  Altered sleeping 0 1 2  Tired, decreased energy 0 2 2  Change in appetite 0 1 2  Feeling bad or failure about yourself  0 1 2  Trouble concentrating 0 2 1  Moving slowly or fidgety/restless 0 1 1  Suicidal thoughts 0 0 0  PHQ-9 Score 0 12 14  Difficult doing work/chores - Somewhat difficult Very difficult  Some recent data might be hidden   GAD 7 : Generalized Anxiety Score 06/12/2020 05/22/2020 01/11/2020 11/22/2019  Nervous, Anxious, on Edge _0 Control/stop worrying _1 Worry too much - different things _2 Trouble relaxing _3 Restless _4 Easily annoyed or irritable _5 Afraid - awful might happen _6 0  Total GAD 7 Score _7 Anxiety Difficulty Somewhat difficult Very difficult Very difficult Somewhat difficult    Assessment/ Plan: 19 y.o. female   Gastroesophageal reflux disease without esophagitis  Encounter for surveillance of injectable contraceptive - Plan: medroxyPROGESTERone (DEPO-PROVERA) 150 MG/ML injection  Morbid obesity ([place]) - Plan: CMP14+EGFR, Bayer DCA Hb A1c Waived  Elevated BP without diagnosis of hypertension  Chronic diarrhea - Plan: Cdiff NAA+O+P+Stool Culture, Fecal fat, qualitative, CMP14+EGFR, Thyroid Panel With TSH, CBC with Differential  Concentration deficit - Plan: Ambulatory referral to Psychiatry  Anxiety - Plan: Ambulatory referral to Psychiatry  Social phobia - Plan: Ambulatory referral to Psychiatry  Depressive disorder -  Plan: Ambulatory referral to Psychiatry  Acid reflux has been exacerbated because she has been off of her acid reflux medicine.  PPI has been renewed.  Depo-Provera renewed.  She has an appointment scheduled for injection  Her weight continues to escalate and I am worried about this.  Ongoing check for metabolic complications.  A1c checked.  May need to consider obstructive sleep apnea evaluation given chronic fatigue.  Blood pressure persistently elevated.  If ongoing elevation of blood pressure by her check in 6 weeks, my recommendation is going to be to start blood pressure medication in this patient  Ongoing chronic diarrhea.  We will check for metabolic etiology but have also sent home stool studies for further evaluation.  I suspect IBS given ongoing mental health concerns but will  certainly evaluate for possible metabolic etiology.  I have advised use of probiotic daily.  I have given her a handout on chronic diarrhea and we discussed some of the foods to avoid, particularly dairy since this seems to be something that exacerbates her symptoms.  Could ultimately consider use of TCA for anxiety, depression and GI issues but will await for recheck.  New referral placed to psychiatry for further evaluation and management of her multiple mental health symptoms.   No orders of the defined types were placed in this encounter.  No orders of the defined types were placed in this encounter.    Janora Norlander, DO [place] [phone]

## 2020-11-04 NOTE — Patient Instructions (Signed)
Culturelle or Align probiotics for the diarrhea (generic fine)  I recommend ADHD evaluation with psychiatry. Wellbutrin called in.  You had labs performed today.  You will be contacted with the results of the labs once they are available, usually in the next 3 business days for routine lab work.  If you have an active my chart account, they will be released to your MyChart.  If you prefer to have these labs released to you via telephone, please let us know.  If you had a pap smear or biopsy performed, expect to be contacted in about 7-10 days.   Chronic Diarrhea Chronic diarrhea is a condition in which a person passes frequent loose and watery stools for 4 weeks or longer. Non-chronic diarrhea usually lasts for only 2-3 days. Diarrhea can cause a person to feel weak and dehydrated. Dehydration can make the person tired and thirsty. It can also cause a dry mouth, decreased urination, and dark yellow urine. Diarrhea is a sign of an underlying problem, such as:  Infection.  Side effects of medicines.  Problems digesting something in your diet, such as milk products if you have lactose intolerance.  Conditions such as celiac disease, irritable bowel syndrome (IBS), or inflammatory bowel disease (IBD). If you have chronic diarrhea, make sure you treat it as told by your health care provider. Follow these instructions at home: Medicines  Take over-the-counter and prescription medicines only as told by your health care provider.  If you were prescribed an antibiotic medicine, take it as told by your health care provider. Do not stop taking the antibiotic even if you start to feel better. Eating and drinking   Follow instructions from your health care provider about what to eat and drink. You may have to: ? Avoid foods that trigger diarrhea for you. ? Take an oral rehydration solution (ORS). This is a drink that keeps you hydrated. It can be found at pharmacies and retail stores. ? Drink  clear fluids, such as water, diluted fruit juice, and low-calorie sports drinks. You can also get fluids by sucking on ice chips. ? Drink enough fluid to keep your urine pale yellow. This will help you avoid dehydration. ? Eat small amounts of bland foods that are easy to digest as you are able. These foods include bananas, applesauce, rice, lean meats, toast, and crackers. ? Avoid spicy or fatty foods. ? Avoid foods and beverages that contain a lot of sugar or caffeine.  Do not drink alcohol if: ? Your health care provider tells you not to drink. ? You are pregnant, may be pregnant, or are planning to become pregnant.  If you drink alcohol: ? Limit how much you use to:  0-1 drink a day for women.  0-2 drinks a day for men. ? Be aware of how much alcohol is in your drink. In the [place] one drink equals one 12 oz bottle of beer (355 mL), one 5 oz glass of wine (148 mL), or one 1 oz glass of hard liquor (44 mL). General instructions   Wash your hands often and after each diarrhea episode. Use soap and water. If soap and water are not available, use hand sanitizer.  Make sure that all people in your household wash their hands well and often.  Rest as told by your health care provider.  Watch your condition for any changes.  Take a warm bath to relieve any burning or pain from frequent diarrhea episodes.  Keep all follow-up visits as told by  your health care provider. This is important. Contact a health care provider if:  You have a fever.  Your diarrhea gets worse or does not get better.  You have new symptoms.  You cannot drink fluid without vomiting.  You feel light-headed or dizzy.  You have a headache.  You have muscle cramps.  You have severe pain in the rectum. Get help right away if:  You have vomiting that does not go away.  You have chest pain.  You feel very weak or you faint.  You have bloody or black stools, or stools that look like tar.  You have  severe pain, cramping, or bloating in your abdomen, or pain that stays in one place.  You have trouble breathing or you are breathing very quickly.  Your heart is beating very quickly.  Your skin feels cold and clammy.  You feel confused.  You have a severe headache.  You have signs or symptoms of dehydration, such as: ? Dark urine, very little urine, or no urine. ? Cracked lips. ? Dry mouth. ? Sunken eyes. ? Sleepiness. ? Weakness. These symptoms may represent a serious problem that is an emergency. Do not wait to see if the symptoms will go away. Get medical help right away. Call your local emergency services (911 in the [place] Do not drive yourself to the hospital. Summary  Chronic diarrhea is a condition in which a person passes frequent loose and watery stools for 4 weeks or longer.  Diarrhea is a sign of an underlying problem.  Make sure you treat your diarrhea as told by your health care provider.  Drink enough fluid to keep your urine pale yellow. This will help you avoid dehydration.  Wash your hands often and after each diarrhea episode. If soap and water are not available, use hand sanitizer. This information is not intended to replace advice given to you by your health care provider. Make sure you discuss any questions you have with your health care provider. Document Revised: 04/24/2019 Document Reviewed: 04/24/2019 Elsevier Patient Education  2020 [employer].

## 2020-11-05 LAB — CBC WITH DIFFERENTIAL/PLATELET
Basophils Absolute: 0 10*3/uL (ref 0.0–0.2)
Basos: 0 %
EOS (ABSOLUTE): 0.2 10*3/uL (ref 0.0–0.4)
Eos: 1 %
Hematocrit: 41.5 % (ref 34.0–46.6)
Hemoglobin: 13.8 g/dL (ref 11.1–15.9)
Immature Grans (Abs): 0.1 10*3/uL (ref 0.0–0.1)
Immature Granulocytes: 1 %
Lymphocytes Absolute: 2.2 10*3/uL (ref 0.7–3.1)
Lymphs: 18 %
MCH: 28 pg (ref 26.6–33.0)
MCHC: 33.3 g/dL (ref 31.5–35.7)
MCV: 84 fL (ref 79–97)
Monocytes Absolute: 0.8 10*3/uL (ref 0.1–0.9)
Monocytes: 6 %
Neutrophils Absolute: 9.3 10*3/uL — ABNORMAL HIGH (ref 1.4–7.0)
Neutrophils: 74 %
Platelets: 428 10*3/uL (ref 150–450)
RBC: 4.93 x10E6/uL (ref 3.77–5.28)
RDW: 15.5 % — ABNORMAL HIGH (ref 11.7–15.4)
WBC: 12.6 10*3/uL — ABNORMAL HIGH (ref 3.4–10.8)

## 2020-11-05 LAB — CMP14+EGFR
ALT: 35 IU/L — ABNORMAL HIGH (ref 0–32)
AST: 29 IU/L (ref 0–40)
Albumin/Globulin Ratio: 2.2 (ref 1.2–2.2)
Albumin: 4.2 g/dL (ref 3.9–5.0)
Alkaline Phosphatase: 190 IU/L — ABNORMAL HIGH (ref 42–106)
BUN/Creatinine Ratio: 10 (ref 9–23)
BUN: 5 mg/dL — ABNORMAL LOW (ref 6–20)
Bilirubin Total: 0.3 mg/dL (ref 0.0–1.2)
CO2: 23 mmol/L (ref 20–29)
Calcium: 8.8 mg/dL (ref 8.7–10.2)
Chloride: 103 mmol/L (ref 96–106)
Creatinine, Ser: 0.52 mg/dL — ABNORMAL LOW (ref 0.57–1.00)
GFR calc Af Amer: 160 mL/min/{1.73_m2} (ref >59)
GFR calc non Af Amer: 139 mL/min/{1.73_m2} (ref >59)
Globulin, Total: 1.9 g/dL (ref 1.5–4.5)
Glucose: 106 mg/dL — ABNORMAL HIGH (ref 65–99)
Potassium: 4.1 mmol/L (ref 3.5–5.2)
Sodium: 141 mmol/L (ref 134–144)
Total Protein: 6.1 g/dL (ref 6.0–8.5)

## 2020-11-05 LAB — THYROID PANEL WITH TSH
Free Thyroxine Index: 2.3 (ref 1.2–4.9)
T3 Uptake Ratio: 25 % (ref 24–39)
T4, Total: 9.1 ug/dL (ref 4.5–12.0)
TSH: 1.8 u[IU]/mL (ref 0.450–4.500)

## 2020-11-07 ENCOUNTER — Ambulatory Visit (INDEPENDENT_AMBULATORY_CARE_PROVIDER_SITE_OTHER): Payer: BC Managed Care – PPO | Admitting: Family Medicine

## 2020-11-07 ENCOUNTER — Other Ambulatory Visit: Payer: Self-pay

## 2020-11-07 DIAGNOSIS — Z3042 Encounter for surveillance of injectable contraceptive: Secondary | ICD-10-CM | POA: Diagnosis not present

## 2020-11-07 MED ORDER — MEDROXYPROGESTERONE ACETATE 150 MG/ML IM SUSP
150.0000 mg | Freq: Once | INTRAMUSCULAR | Status: AC
  Administered 2020-11-07: 150 mg via INTRAMUSCULAR

## 2020-12-16 ENCOUNTER — Ambulatory Visit: Payer: BC Managed Care – PPO | Admitting: Family Medicine

## 2021-01-27 ENCOUNTER — Encounter: Payer: Self-pay | Admitting: Family Medicine

## 2021-01-28 ENCOUNTER — Encounter: Payer: Self-pay | Admitting: Family Medicine

## 2021-01-28 ENCOUNTER — Other Ambulatory Visit: Payer: Self-pay

## 2021-01-28 ENCOUNTER — Ambulatory Visit (INDEPENDENT_AMBULATORY_CARE_PROVIDER_SITE_OTHER): Payer: BC Managed Care – PPO | Admitting: Family Medicine

## 2021-01-28 ENCOUNTER — Other Ambulatory Visit: Payer: Self-pay | Admitting: *Deleted

## 2021-01-28 ENCOUNTER — Ambulatory Visit: Payer: BC Managed Care – PPO

## 2021-01-28 VITALS — BP 138/99 | HR 112 | Temp 97.4°F | Resp 20 | Ht 66.0 in | Wt 305.0 lb

## 2021-01-28 DIAGNOSIS — F329 Major depressive disorder, single episode, unspecified: Secondary | ICD-10-CM | POA: Diagnosis not present

## 2021-01-28 DIAGNOSIS — F411 Generalized anxiety disorder: Secondary | ICD-10-CM

## 2021-01-28 DIAGNOSIS — Z3042 Encounter for surveillance of injectable contraceptive: Secondary | ICD-10-CM

## 2021-01-28 DIAGNOSIS — Z30013 Encounter for initial prescription of injectable contraceptive: Secondary | ICD-10-CM | POA: Diagnosis not present

## 2021-01-28 DIAGNOSIS — R41840 Attention and concentration deficit: Secondary | ICD-10-CM | POA: Diagnosis not present

## 2021-01-28 DIAGNOSIS — F32A Depression, unspecified: Secondary | ICD-10-CM

## 2021-01-28 MED ORDER — MEDROXYPROGESTERONE ACETATE 150 MG/ML IM SUSP: 150.0000 mg | INTRAMUSCULAR | 1 refills | Status: DC

## 2021-01-28 MED ORDER — SERTRALINE HCL 50 MG PO TABS: 50.0000 mg | ORAL_TABLET | Freq: Every day | ORAL | 1 refills | Status: DC

## 2021-01-28 NOTE — Progress Notes (Signed)
Subjective: CC: BP, GAD/ attention PCP: Raliegh Ip, DO LTR:[id number] B Debra Copeland is a 20 y.o. female presenting to clinic today for:  Generalized anxiety disorders, depression Patient with ongoing anxiety and depression.  She often compares herself to other folks, citing that she has not really done anything.  She was not able to stay in college due to difficulty living on campus.  She feels that she is estranged from her previous friends.  She states that she has anxiety and separation anxiety when she is not with her brother.  They essentially are together all the time at home.  She has ruminating behaviors and often reflects on her past and the "what if's."  She does feel like she is ready to talk to a therapist and psychiatrist.  No reports of SI or HI.  She does report sleep abnormalities.  ROS: Per HPI  No Known Allergies Past Medical History:  Diagnosis Date  . Anxiety   . Depression     Current Outpatient Medications:  .  medroxyPROGESTERone (DEPO-PROVERA) 150 MG/ML injection, Inject 1 mL (150 mg total) into the muscle every 3 (three) months., Disp: 1 mL, Rfl: 1 .  omeprazole (PRILOSEC) 20 MG capsule, TAKE 1 CAPSULE BY MOUTH ONCE DAILY FOR ACID REFLUX, Disp: 90 capsule, Rfl: 3 .  buPROPion (WELLBUTRIN XL) 150 MG 24 hr tablet, Take 1 tablet (150 mg total) by mouth daily. (Patient not taking: Reported on 01/28/2021), Disp: 90 tablet, Rfl: 0  Current Facility-Administered Medications:  .  medroxyPROGESTERone (DEPO-PROVERA) injection 150 mg, 150 mg, Intramuscular, Q90 days, Daylani Deblois M, DO, 150 mg at 08/21/20 1537 Social History   Socioeconomic History  . Marital status: Single    Spouse name: Not on file  . Number of children: Not on file  . Years of education: Not on file  . Highest education level: Not on file  Occupational History  . Not on file  Tobacco Use  . Smoking status: Never Smoker  . Smokeless tobacco: Never Used  Vaping Use  . Vaping Use:  Never used  Substance and Sexual Activity  . Alcohol use: Yes    Comment: occ  . Drug use: Never  . Sexual activity: Not Currently    Birth control/protection: Pill, Injection  Other Topics Concern  . Not on file  Social History Narrative   In early college.   Social Determinants of Health   Financial Resource Strain: Not on file  Food Insecurity: Not on file  Transportation Needs: Not on file  Physical Activity: Not on file  Stress: Not on file  Social Connections: Not on file  Intimate Partner Violence: Not on file   Family History  Problem Relation Age of Onset  . Hypertension Father   . Hypothyroidism Father   . Vitamin D deficiency Father   . Bipolar disorder Maternal Uncle   . Bipolar disorder Maternal Grandfather   . Depression Paternal Grandmother     Objective: Office vital signs reviewed. BP (!) 138/99   Pulse (!) 112   Temp (!) 97.4 F (36.3 C)   Resp 20   Ht 5\' 6"  (1.676 m)   Wt (!) 305 lb (138.3 kg)   LMP 01/05/2021 (Approximate) Comment: spotting   SpO2 98%   BMI 49.23 kg/m   Physical Examination:  General: Awake, alert, morbidly obese, No acute distress Cardio: regular rate  Pulm:  normal work of breathing on room air Psych: Mood is depressed.  Patient is tearful.  Good eye contact.  Does not appear to be responding to internal stimuli  Depression screen [facility] 2/9 01/28/2021 11/04/2020 06/12/2020  Decreased Interest 1 0 2  Down, Depressed, Hopeless 1 0 2  PHQ - 2 Score 2 0 4  Altered sleeping 2 0 1  Tired, decreased energy 2 0 2  Change in appetite 2 0 1  Feeling bad or failure about yourself  1 0 1  Trouble concentrating 2 0 2  Moving slowly or fidgety/restless 3 0 1  Suicidal thoughts 0 0 0  PHQ-9 Score 14 0 12  Difficult doing work/chores Somewhat difficult - Somewhat difficult  Some recent data might be hidden   GAD 7 : Generalized Anxiety Score 01/28/2021 06/12/2020 05/22/2020 01/11/2020  Nervous, Anxious, on Edge 2 2 2 2   Control/stop  worrying 3 2 2 1   Worry too much - different things 3 2 2 1   Trouble relaxing 2 2 1 2   Restless 1 2 1 2   Easily annoyed or irritable 1 2 3 2   Afraid - awful might happen 1 2 2 1   Total GAD 7 Score 13 14 13 11   Anxiety Difficulty Somewhat difficult Somewhat difficult Very difficult Very difficult    Assessment/ Plan: 20 y.o. female   Depressive disorder - Plan: sertraline (ZOLOFT) 50 MG tablet, Ambulatory referral to Psychiatry  Generalized anxiety disorder - Plan: sertraline (ZOLOFT) 50 MG tablet, Ambulatory referral to Psychiatry  Concentration deficit  Depression and anxiety are not well controlled.  She really has not started any new medication so unsure if she truly has failed Wellbutrin but given level of anxiety she demonstrates a hesitate to use Wellbutrin alone.  Start Zoloft 50 mg daily.  I have referred her to psychiatry as well as put in a new virtual behavioral health request.  Would benefit from both counseling services and psychiatric intervention and recommendations for medications.  She is agreeable to both referrals.  We will follow-up in the next 6 weeks, sooner if needed   Orders Placed This Encounter  Procedures  . Ambulatory referral to Psychiatry    Referral Priority:   Routine    Referral Type:   Psychiatric    Referral Reason:   Specialty Services Required    Requested Specialty:   Psychiatry    Number of Visits Requested:   1   Meds ordered this encounter  Medications  . sertraline (ZOLOFT) 50 MG tablet    Sig: Take 1 tablet (50 mg total) by mouth daily.    Dispense:  30 tablet    Refill:  1     Knight Oelkers , DO [facility] [phone]

## 2021-01-28 NOTE — Progress Notes (Signed)
Depo given without difficulty. Pt is scheduled to come back 6/7 for next injection

## 2021-01-29 ENCOUNTER — Telehealth: Payer: Self-pay | Admitting: Licensed Clinical Social Worker

## 2021-01-29 DIAGNOSIS — F411 Generalized anxiety disorder: Secondary | ICD-10-CM

## 2021-01-29 DIAGNOSIS — F329 Major depressive disorder, single episode, unspecified: Secondary | ICD-10-CM

## 2021-01-29 DIAGNOSIS — F32A Depression, unspecified: Secondary | ICD-10-CM

## 2021-01-29 NOTE — BH Specialist Note (Signed)
Virtual Behavioral Health Treatment Plan Team Note  MRN: [id number] NAME: Debra Copeland  DATE: 03/13/21  Start time:   410pEnd time:  420p Total time:  10 min  Total number of Virtual BH Treatment Team Plan encounters: 3/4  Treatment Team Attendees: Nolon Rod, LCSW; Dr. Vanetta Shawl, Psychiatrist  Diagnoses:    ICD-10-CM   1. Generalized anxiety disorder  F41.1   2. Depressive disorder  F32.9     Goals, Interventions and Follow-up Plan Goals: Increase healthy adjustment to current life circumstances Increase adequate support systems for patient/family Interventions: Brief CBT Medication Management Recommendations: no change Follow-up Plan: Continue with VBH services  History of the present illness Presenting Problem/Current Symptoms: continued symptoms of her diagnosis  Psychiatric History  Depression: Yes Anxiety: Yes Mania: No Psychosis: No PTSD symptoms: No  Past Psychiatric History/Hospitalization(s): Hospitalization for psychiatric illness: No Prior Suicide Attempts: No Prior Self-injurious behavior: No  Psychosocial stressors: school   Self-harm Behaviors Risk Assessment   Screenings PHQ-9 Assessments:  Depression screen [facility] 2/9 03/05/2021 01/28/2021 11/04/2020  Decreased Interest 0 1 0  Down, Depressed, Hopeless 0 1 0  PHQ - 2 Score 0 2 0  Altered sleeping - 2 0  Tired, decreased energy - 2 0  Change in appetite - 2 0  Feeling bad or failure about yourself  - 1 0  Trouble concentrating - 2 0  Moving slowly or fidgety/restless - 3 0  Suicidal thoughts - 0 0  PHQ-9 Score - 14 0  Difficult doing work/chores - Somewhat difficult -  Some recent data might be hidden   GAD-7 Assessments:  GAD 7 : Generalized Anxiety Score 01/28/2021 06/12/2020 05/22/2020 01/11/2020  Nervous, Anxious, on Edge 2 2 2 2   Control/stop worrying 3 2 2 1   Worry too much - different things 3 2 2 1   Trouble relaxing 2 2 1 2   Restless 1 2 1 2   Easily annoyed or irritable 1 2 3  2   Afraid - awful might happen 1 2 2 1   Total GAD 7 Score 13 14 13 11   Anxiety Difficulty Somewhat difficult Somewhat difficult Very difficult Very difficult    Past Medical History Past Medical History:  Diagnosis Date  . Anxiety   . Depression     Vital signs: There were no vitals filed for this visit.  Allergies:  Allergies as of 01/29/2021  . (No Known Allergies)    Medication History Current medications:  Outpatient Encounter Medications as of 01/29/2021  Medication Sig  . omeprazole (PRILOSEC) 20 MG capsule TAKE 1 CAPSULE BY MOUTH ONCE DAILY FOR ACID REFLUX  . [DISCONTINUED] medroxyPROGESTERone (DEPO-PROVERA) 150 MG/ML injection Inject 1 mL (150 mg total) into the muscle every 3 (three) months.  . [DISCONTINUED] sertraline (ZOLOFT) 50 MG tablet Take 1 tablet (50 mg total) by mouth daily. (Patient not taking: Reported on 02/28/2021)   Facility-Administered Encounter Medications as of 01/29/2021  Medication  . medroxyPROGESTERone (DEPO-PROVERA) injection 150 mg     Scribe for Treatment Team: , LCSW

## 2021-02-03 ENCOUNTER — Telehealth: Payer: Self-pay | Admitting: Family Medicine

## 2021-02-28 ENCOUNTER — Emergency Department (HOSPITAL_COMMUNITY)
Admission: EM | Admit: 2021-02-28 | Discharge: 2021-02-28 | Discharge status: Against Medical Advice | Payer: BC Managed Care – PPO | Attending: Emergency Medicine | Admitting: Emergency Medicine

## 2021-02-28 ENCOUNTER — Other Ambulatory Visit: Payer: Self-pay

## 2021-02-28 ENCOUNTER — Encounter (HOSPITAL_COMMUNITY): Payer: Self-pay | Admitting: *Deleted

## 2021-02-28 ENCOUNTER — Emergency Department (HOSPITAL_COMMUNITY): Payer: BC Managed Care – PPO

## 2021-02-28 DIAGNOSIS — H5461 Unqualified visual loss, right eye, normal vision left eye: Secondary | ICD-10-CM

## 2021-02-28 DIAGNOSIS — H547 Unspecified visual loss: Secondary | ICD-10-CM | POA: Insufficient documentation

## 2021-02-28 DIAGNOSIS — R000 Tachycardia, unspecified: Secondary | ICD-10-CM | POA: Diagnosis not present

## 2021-02-28 LAB — CBC WITH DIFFERENTIAL/PLATELET
Abs Immature Granulocytes: 0.07 10*3/uL (ref 0.00–0.07)
Basophils Absolute: 0.1 10*3/uL (ref 0.0–0.1)
Basophils Relative: 0 %
Eosinophils Absolute: 0.1 10*3/uL (ref 0.0–0.5)
Eosinophils Relative: 0 %
HCT: 42.3 % (ref 36.0–46.0)
Hemoglobin: 14.7 g/dL (ref 12.0–15.0)
Immature Granulocytes: 0 %
Lymphocytes Relative: 12 %
Lymphs Abs: 1.8 10*3/uL (ref 0.7–4.0)
MCH: 32.6 pg (ref 26.0–34.0)
MCHC: 34.8 g/dL (ref 30.0–36.0)
MCV: 93.8 fL (ref 80.0–100.0)
Monocytes Absolute: 0.8 10*3/uL (ref 0.1–1.0)
Monocytes Relative: 5 %
Neutro Abs: 13 10*3/uL — ABNORMAL HIGH (ref 1.7–7.7)
Neutrophils Relative %: 83 %
Platelets: 448 10*3/uL — ABNORMAL HIGH (ref 150–400)
RBC: 4.51 MIL/uL (ref 3.87–5.11)
RDW: 17 % — ABNORMAL HIGH (ref 11.5–15.5)
WBC: 15.7 10*3/uL — ABNORMAL HIGH (ref 4.0–10.5)
nRBC: 0 % (ref 0.0–0.2)

## 2021-02-28 LAB — BASIC METABOLIC PANEL
Anion gap: 12 (ref 5–15)
BUN: <5 mg/dL — ABNORMAL LOW (ref 6–20)
CO2: 22 mmol/L (ref 22–32)
Calcium: 9.2 mg/dL (ref 8.9–10.3)
Chloride: 106 mmol/L (ref 98–111)
Creatinine, Ser: 0.56 mg/dL (ref 0.44–1.00)
GFR, Estimated: >60 mL/min (ref >60)
Glucose, Bld: 115 mg/dL — ABNORMAL HIGH (ref 70–99)
Potassium: 3.7 mmol/L (ref 3.5–5.1)
Sodium: 140 mmol/L (ref 135–145)

## 2021-02-28 MED ORDER — GADOBUTROL 1 MMOL/ML IV SOLN
10.0000 mL | Freq: Once | INTRAVENOUS | Status: AC | PRN
  Administered 2021-02-28: 10 mL via INTRAVENOUS

## 2021-02-28 NOTE — Discharge Instructions (Addendum)
You were seen today for a concerning vision loss in your right eye.  Your MRI showed a potential abnormality in the right optic nerve, however this was not definitive.  We spoke with neurology tonight who recommended lumbar puncture.  You will be discharged tonight based on your preferences.  Please follow-up with ophthalmology and neurology within the next week.  I placed a referral request for you for neurology.  If you change your mind and would like further work-up or evaluation, please return to the emergency department.  If your symptoms worsen or you develop numbness or tingling, weakness in your arms or legs, progressive vision loss, difficulty speaking or walking, please return immediately to the emergency department.

## 2021-02-28 NOTE — ED Notes (Signed)
Patient transported to MRI 

## 2021-02-28 NOTE — ED Triage Notes (Signed)
Pt states she has problems with her eyes, worse the last month, Rt worse than left. Send here to be seen and get a MRI

## 2021-02-28 NOTE — ED Provider Notes (Signed)
[facility]-EMERGENCY DEPT Provider Note   CSN: [id number] Arrival date & time: 02/28/21  1614     History Chief Complaint  Patient presents with  . Eye Problem    Debra Copeland is a 20 y.o. female.  Patient with history of depression presents emergency department for evaluation of vision loss.  Patient states that for the past 1 month approximately, she has had central vision loss, "like a light", in the right eye.  She describes it as going into a bright light, but the vision never improved.  Peripherally she seems to be normal.  No vision complaints in the left eye.  No weakness, numbness, or tingling in the extremities.  Patient was seen by ophthalmology today, Dr. Georges Mouse with [facility]  Her exam was concerning for optic neuritis and she was sent to the emergency department for evaluation.  There she was found to have visual field defects bilaterally.  Recommended MRI brain and orbits with and without contrast. The onset of this condition was acute. The course is constant. Aggravating factors: none. Alleviating factors: none.          Past Medical History:  Diagnosis Date  . Anxiety   . Depression     Patient Active Problem List   Diagnosis Date Noted  . Deliberate self-cutting 01/11/2020  . Depressive disorder 01/11/2020  . Gastroesophageal reflux disease without esophagitis 09/26/2018  . Generalized anxiety disorder 08/08/2018  . Elevated BP without diagnosis of hypertension 08/08/2018  . Morbid obesity (HCC) 07/04/2018  . Encounter for contraceptive management 08/09/2017  . Social phobia 08/07/2015    History reviewed. No pertinent surgical history.   OB History   No obstetric history on file.     Family History  Problem Relation Age of Onset  . Hypertension Father   . Hypothyroidism Father   . Vitamin D deficiency Father   . Bipolar disorder Maternal Uncle   . Bipolar disorder Maternal Grandfather   .  Depression Paternal Grandmother     Social History   Tobacco Use  . Smoking status: Never Smoker  . Smokeless tobacco: Never Used  Vaping Use  . Vaping Use: Never used  Substance Use Topics  . Alcohol use: Yes    Comment: occ  . Drug use: Never    Home Medications Prior to Admission medications   Medication Sig Start Date End Date Taking? Authorizing Provider  medroxyPROGESTERone (DEPO-PROVERA) 150 MG/ML injection Inject 1 mL (150 mg total) into the muscle every 3 (three) months. 01/28/21   Delynn Flavin M, DO  omeprazole (PRILOSEC) 20 MG capsule TAKE 1 CAPSULE BY MOUTH ONCE DAILY FOR ACID REFLUX 11/04/20   Delynn Flavin M, DO  sertraline (ZOLOFT) 50 MG tablet Take 1 tablet (50 mg total) by mouth daily. 01/28/21   Raliegh Ip, DO    Allergies    Patient has no known allergies.  Review of Systems   Review of Systems  Constitutional: Negative for fever.  HENT: Negative for congestion, dental problem, rhinorrhea and sinus pressure.   Eyes: Positive for photophobia (question mild, in bright light) and visual disturbance. Negative for discharge and redness.  Respiratory: Negative for shortness of breath.   Cardiovascular: Negative for chest pain.  Gastrointestinal: Negative for nausea and vomiting.  Musculoskeletal: Negative for gait problem, neck pain and neck stiffness.  Skin: Negative for rash.  Neurological: Negative for syncope, speech difficulty, weakness, light-headedness, numbness and headaches.  Psychiatric/Behavioral: Negative for confusion.    Physical  Exam Updated Vital Signs BP (!) 176/130 (BP Location: Left Arm)   Pulse (!) 129   Temp 98.6 F (37 C) (Oral)   Resp (!) 26   Ht 5\' 6"  (1.676 m)   Wt 134.7 kg   SpO2 99%   BMI 47.94 kg/m   Physical Exam Vitals and nursing note reviewed.  Constitutional:      General: She is in acute distress (Tearful).     Appearance: She is well-developed. She is obese.  HENT:     Head: Normocephalic and  atraumatic.     Right Ear: External ear normal.     Left Ear: External ear normal.     Nose: Nose normal.  Eyes:     Conjunctiva/sclera: Conjunctivae normal.     Comments: Pupils dilated bilaterally, from ophthalmology exam.  Sclera bilaterally grossly normal.  Cardiovascular:     Rate and Rhythm: Regular rhythm. Tachycardia present.     Heart sounds: No murmur heard.   Pulmonary:     Effort: No respiratory distress.     Breath sounds: No wheezing, rhonchi or rales.  Abdominal:     Palpations: Abdomen is soft.     Tenderness: There is no abdominal tenderness. There is no guarding or rebound.  Musculoskeletal:     Cervical back: Normal range of motion and neck supple.     Right lower leg: No edema.     Left lower leg: No edema.  Skin:    General: Skin is warm and dry.     Findings: No rash.  Neurological:     General: No focal deficit present.     Mental Status: She is alert. Mental status is at baseline.     Motor: No weakness.  Psychiatric:        Mood and Affect: Mood is anxious.     ED Results / Procedures / Treatments   Labs (all labs ordered are listed, but only abnormal results are displayed) Labs Reviewed  CBC WITH DIFFERENTIAL/PLATELET - Abnormal; Notable for the following components:      Result Value   WBC 15.7 (*)    RDW 17.0 (*)    Platelets 448 (*)    Neutro Abs 13.0 (*)    All other components within normal limits  BASIC METABOLIC PANEL - Abnormal; Notable for the following components:   Glucose, Bld 115 (*)    BUN <5 (*)    All other components within normal limits    EKG None  Radiology MR Brain W and Wo Contrast  Result Date: 02/28/2021 CLINICAL DATA:  Monocular vision loss.  Multiple sclerosis. EXAM: MRI HEAD AND ORBITS WITHOUT AND WITH CONTRAST TECHNIQUE: Multiplanar, multiecho pulse sequences of the brain and surrounding structures were obtained without and with intravenous contrast. Multiplanar, multiecho pulse sequences of the orbits and  surrounding structures were obtained including fat saturation techniques, before and after intravenous contrast administration. CONTRAST:  45mL GADAVIST GADOBUTROL 1 MMOL/ML IV SOLN COMPARISON:  None. FINDINGS: MRI HEAD FINDINGS Brain: No acute infarct, mass effect or extra-axial collection. No acute or chronic hemorrhage. There is minimal periventricular white matter hyperintensity, nonspecific. The midline structures are normal. Incidentally noted pineal gland cyst. Vascular: Major flow voids are preserved. Skull and upper cervical spine: Normal calvarium and skull base. Visualized upper cervical spine and soft tissues are normal. MRI ORBITS FINDINGS Orbits: Questionable hyperintense T2-weighted signal within the cisternal segment of the right optic nerve (series 20 images 12 and 13). The orbits are otherwise normal. Visualized  sinuses: Normal Soft tissues: Normal IMPRESSION: 1. No acute intracranial abnormality. 2. Questionable hyperintense T2-weighted signal within the cisternal segment of the right optic nerve, equivocal for optic neuritis. 3. No evidence of intracranial demyelinating disease. Electronically Signed   By: Deatra Robinson M.D.   On: 02/28/2021 21:27   MR ORBITS W WO CONTRAST  Result Date: 02/28/2021 CLINICAL DATA:  Monocular vision loss.  Multiple sclerosis. EXAM: MRI HEAD AND ORBITS WITHOUT AND WITH CONTRAST TECHNIQUE: Multiplanar, multiecho pulse sequences of the brain and surrounding structures were obtained without and with intravenous contrast. Multiplanar, multiecho pulse sequences of the orbits and surrounding structures were obtained including fat saturation techniques, before and after intravenous contrast administration. CONTRAST:  67mL GADAVIST GADOBUTROL 1 MMOL/ML IV SOLN COMPARISON:  None. FINDINGS: MRI HEAD FINDINGS Brain: No acute infarct, mass effect or extra-axial collection. No acute or chronic hemorrhage. There is minimal periventricular white matter hyperintensity,  nonspecific. The midline structures are normal. Incidentally noted pineal gland cyst. Vascular: Major flow voids are preserved. Skull and upper cervical spine: Normal calvarium and skull base. Visualized upper cervical spine and soft tissues are normal. MRI ORBITS FINDINGS Orbits: Questionable hyperintense T2-weighted signal within the cisternal segment of the right optic nerve (series 20 images 12 and 13). The orbits are otherwise normal. Visualized sinuses: Normal Soft tissues: Normal IMPRESSION: 1. No acute intracranial abnormality. 2. Questionable hyperintense T2-weighted signal within the cisternal segment of the right optic nerve, equivocal for optic neuritis. 3. No evidence of intracranial demyelinating disease. Electronically Signed   By: Deatra Robinson M.D.   On: 02/28/2021 21:27    Procedures Procedures   Medications Ordered in ED Medications  gadobutrol (GADAVIST) 1 MMOL/ML injection 10 mL (10 mLs Intravenous Contrast Given 02/28/21 1909)    ED Course  I have reviewed the triage vital signs and the nursing notes.  Pertinent labs & imaging results that were available during my care of the patient were reviewed by me and considered in my medical decision making (see chart for details).  Patient seen and examined. Work-up initiated. Imaging ordered.   Vital signs reviewed and are as follows: BP (!) 176/130 (BP Location: Left Arm)   Pulse (!) 129   Temp 98.6 F (37 C) (Oral)   Resp (!) 26   Ht 5\' 6"  (1.676 m)   Wt 134.7 kg   SpO2 99%   BMI 47.94 kg/m   10:53 PM initially there was a delay in obtaining imaging results after MRI performed.  MRI findings as noted above.  I discussed these with Dr. of neurology who recommended lumbar puncture and additional studies.  Patient does not have signs of multiple sclerosis on MRI and agrees that images are equivocal.  Patient's mother now at bedside.  I discussed all findings with patient and mother at bedside.  This included MRI  results and lab results including elevated white blood cell count.  We discussed risks and benefits of lumbar puncture including detailing what the procedure entails.  I told the patient that I am unable to give a prognosis in regards to her vision returning at this time due to not having enough information.  After discussion, patient declines lumbar puncture.  She does not want this to be performed tonight.  I asked her if she would be willing to be admitted to the hospital if this was recommended, and she states that she declines admission.  She is willing to follow-up with a neurologist as an outpatient.  We discussed that it  is possible without further evaluation and treatment, that she may not regain eyesight.  Although I cannot ensure that with treatment, she would have a better outcome.  I spoke with Dr. Otelia Limes again who recommended prompt follow-up with ophthalmology and outpatient neurology.  No further medication recommendations.  Patient will be discharged AGAINST MEDICAL ADVICE.  We discussed signs and symptoms which should cause her to return.  This includes worsening vision loss or changes, weakness in the arms, legs, sensation deficits, trouble walking or talking, if she decides that she would like further evaluation, or with any other concerns.  Patient verbalizes understanding agrees with plan.  Her mother was actively taking notes at bedside during this discussion.    MDM Rules/Calculators/A&P                          Patient with right eye vision loss, evaluated by ophthalmology today.  MRI does not show widespread MS, equivocal for optic neuritis.  Discussion with neurology as above.  Recommended further evaluation with lumbar puncture and patient declines today.  She would like to go home and not be admitted to the hospital for further work-up.  I have placed ambulatory referral to neurology to assist in obtaining further evaluation.  Patient is already established with  ophthalmology.  Return instructions as above.  Patient seems reliable to make her own medical decisions.  We had an extensive discussion regarding risks and benefits of further work-up and possible harms by not having his work-up.  She is tachycardic at discharge, however suspect that this is related to anxiety.  No concerns for meningitis or other CNS infection, systemic infection, or sepsis.   Final Clinical Impression(s) / ED Diagnoses Final diagnoses:  Vision loss of right eye    Rx / DC Orders ED Discharge Orders    None       Renne Crigler, Cordelia Poche 02/28/21 2259    Wynetta Fines, MD 03/02/21 1455

## 2021-03-03 ENCOUNTER — Encounter: Payer: Self-pay | Admitting: Family Medicine

## 2021-03-05 ENCOUNTER — Other Ambulatory Visit: Payer: Self-pay

## 2021-03-05 ENCOUNTER — Encounter: Payer: Self-pay | Admitting: Family Medicine

## 2021-03-05 ENCOUNTER — Ambulatory Visit: Payer: BC Managed Care – PPO | Admitting: Family Medicine

## 2021-03-05 VITALS — BP 140/95 | HR 118 | Temp 97.5°F | Ht 66.0 in | Wt 295.1 lb

## 2021-03-05 DIAGNOSIS — D72829 Elevated white blood cell count, unspecified: Secondary | ICD-10-CM

## 2021-03-05 DIAGNOSIS — H547 Unspecified visual loss: Secondary | ICD-10-CM | POA: Diagnosis not present

## 2021-03-05 LAB — CBC WITH DIFFERENTIAL/PLATELET
Basophils Absolute: 0.1 10*3/uL (ref 0.0–0.2)
Basos: 0 %
EOS (ABSOLUTE): 0.1 10*3/uL (ref 0.0–0.4)
Eos: 1 %
Hematocrit: 41.4 % (ref 34.0–46.6)
Hemoglobin: 14.1 g/dL (ref 11.1–15.9)
Immature Grans (Abs): 0.1 10*3/uL (ref 0.0–0.1)
Immature Granulocytes: 1 %
Lymphocytes Absolute: 2.8 10*3/uL (ref 0.7–3.1)
Lymphs: 22 %
MCH: 32.9 pg (ref 26.6–33.0)
MCHC: 34.1 g/dL (ref 31.5–35.7)
MCV: 97 fL (ref 79–97)
Monocytes Absolute: 0.8 10*3/uL (ref 0.1–0.9)
Monocytes: 6 %
Neutrophils Absolute: 8.6 10*3/uL — ABNORMAL HIGH (ref 1.4–7.0)
Neutrophils: 70 %
Platelets: 444 10*3/uL (ref 150–450)
RBC: 4.29 x10E6/uL (ref 3.77–5.28)
RDW: 17.8 % — ABNORMAL HIGH (ref 11.7–15.4)
WBC: 12.4 10*3/uL — ABNORMAL HIGH (ref 3.4–10.8)

## 2021-03-05 NOTE — Patient Instructions (Signed)
Visual Disturbances  A visual disturbance is any problem that interferes with your normal vision. This can affect one eye or both eyes. Some types of visual disturbances come and go without treatment and do not cause a permanent problem. Other visual disturbances may be a sign of a medical emergency. Visual disturbances include:  Blurred vision.  Being unable to see certain colors.  Being sensitive to light.  Double vision.  Partial vision loss (visual field deficit).  Being unaware of objects on one side of the body (visual spatial inattention).  Rhythmic eye movements that you cannot control (nystagmus).  Short-term or long-term blindness.  Seeing: ? Floating spots or lines (floaters). ? Flashing or shimmering lights. ? Zigzagging lines or stars. ? The floor as tilted (visual midline shift). ? Things that are not really there (hallucinations). Causes of visual disturbances include:  Eye infection.  The thin membrane at the back of the eye separating from the eyeball (retinal detachment).  High blood pressure.  Migraine.  Glaucoma.  Ischemic stroke.  Cerebral aneurysm. It is important to get your eyes checked by a health care provider or eye specialist (ophthalmologist or optometrist) as soon as possible to determine the cause of your visual disturbance. Follow these instructions at home:  Take over-the-counter and prescription medicines only as told by your health care provider.  Do not use any products that contain nicotine or tobacco, such as cigarettes and e-cigarettes. If you need help quitting, ask your health care provider.  To lower your risk of the problems that can lead to visual disturbances: ? Eat a balanced diet that includes fruits and vegetables, whole grains, lean meat, and low-fat dairy. ? Maintain a healthy weight. Work with your health care provider to lose weight if you need to. ? Exercise regularly. Ask your health care provider what  activities are safe for you.  Do not drive if you have trouble seeing. Ask your health care provider for guidance about when it is and is not safe for you to drive.  Keep all follow-up visits as told by your health care provider. This is important. Contact a health care provider if:  Your visual disturbance changes or becomes worse. Get help right away if you:  Have new visual disturbances.  Suddenly see flashing lights or floaters.  Suddenly have a dark area in your field of vision, especially in the lower part. This can lead to a loss of central vision.  Lose vision in one or both eyes.  Have any symptoms of a stroke. "BE FAST" is an easy way to remember the main warning signs of a stroke: ? B - Balance. Signs are dizziness, sudden trouble walking, or loss of balance. ? E - Eyes. Signs are trouble seeing or a sudden change in vision. ? F - Face. Signs are sudden weakness or numbness of the face, or the face or eyelid drooping on one side. ? A - Arms. Signs are weakness or numbness in an arm. This happens suddenly and usually on one side of the body. ? S - Speech. Signs are sudden trouble speaking, slurred speech, or trouble understanding what people say. ? T - Time. Time to call emergency services. Write down what time symptoms started.  Have other signs of a stroke, such as: ? A sudden, severe headache with no known cause. ? Nausea or vomiting. ? Seizure. These symptoms may represent a serious problem that is an emergency. Do not wait to see if the symptoms will go away.  Get medical help right away. Call your local emergency services (911 in the [place] Do not drive yourself to the hospital.   Summary  A visual disturbance is any problem that interferes with your normal vision.  Some visual disturbances may be a sign of a medical emergency.  It is important to get your eyes checked by a health care provider or eye specialist to determine what kind of visual disturbance you  have. This information is not intended to replace advice given to you by your health care provider. Make sure you discuss any questions you have with your health care provider. Document Revised: 02/14/2019 Document Reviewed: 11/16/2017 Elsevier Patient Education  2021 [employer].

## 2021-03-05 NOTE — Progress Notes (Signed)
Established Patient Office Visit  Subjective:  Patient ID: Debra Copeland, female    DOB: 2001/06/17  Age: 20 y.o. MRN: [id number]  CC:  Chief Complaint  Patient presents with  . Loss of Vision    HPI Debra Copeland presents for vision loss for about a month. It is worse in her right eye. It is central vision loss. She saw her opthalmologist on Friday and was sent to the ED due to concern for optic neuritis. She was evaluated at [facility] ED and had a MRI done. MRI showed questionable hyperintense T2-weighted signal within the cisternal segment of the right optic nerve, equivocal for optic neuritis. A lumbar puncture was recommended in the ED, however patient declined and left AMA. Patient was instructed to follow up with neurology and ophthalmology in 1 week. She denies fever, changes in balance or gait. She does report intermittent tingling in her hands that has been going on for months. She denies weakness, changes in speech, dizziness, neck pain, or headaches.   Past Medical History:  Diagnosis Date  . Anxiety   . Depression     No past surgical history on file.  Family History  Problem Relation Age of Onset  . Hypertension Father   . Hypothyroidism Father   . Vitamin D deficiency Father   . Bipolar disorder Maternal Uncle   . Bipolar disorder Maternal Grandfather   . Depression Paternal Grandmother     Social History   Socioeconomic History  . Marital status: Single    Spouse name: Not on file  . Number of children: Not on file  . Years of education: Not on file  . Highest education level: Not on file  Occupational History  . Not on file  Tobacco Use  . Smoking status: Never Smoker  . Smokeless tobacco: Never Used  Vaping Use  . Vaping Use: Never used  Substance and Sexual Activity  . Alcohol use: Yes    Comment: occ  . Drug use: Never  . Sexual activity: Not Currently    Birth control/protection: Pill, Injection  Other Topics Concern  . Not on file   Social History Narrative   In early college.   Social Determinants of Health   Financial Resource Strain: Not on file  Food Insecurity: Not on file  Transportation Needs: Not on file  Physical Activity: Not on file  Stress: Not on file  Social Connections: Not on file  Intimate Partner Violence: Not on file    Outpatient Medications Prior to Visit  Medication Sig Dispense Refill  . omeprazole (PRILOSEC) 20 MG capsule TAKE 1 CAPSULE BY MOUTH ONCE DAILY FOR ACID REFLUX 90 capsule 3  . sertraline (ZOLOFT) 50 MG tablet Take 1 tablet (50 mg total) by mouth daily. (Patient not taking: Reported on 02/28/2021) 30 tablet 1   Facility-Administered Medications Prior to Visit  Medication Dose Route Frequency Provider Last Rate Last Admin  . medroxyPROGESTERone (DEPO-PROVERA) injection 150 mg  150 mg Intramuscular Q90 days Gottschalk, Ashly M, DO   150 mg at 01/28/21 1538    No Known Allergies  ROS Review of Systems As per HPI.    Objective:    Physical Exam Vitals and nursing note reviewed.  Constitutional:      General: She is not in acute distress.    Appearance: She is not ill-appearing or toxic-appearing.  HENT:     Head: Normocephalic and atraumatic.  Eyes:     Extraocular Movements: Extraocular movements intact.  Conjunctiva/sclera: Conjunctivae normal.     Pupils: Pupils are equal, round, and reactive to light.  Cardiovascular:     Rate and Rhythm: Normal rate and regular rhythm.     Heart sounds: Normal heart sounds. No murmur heard.   Pulmonary:     Effort: Pulmonary effort is normal. No respiratory distress.     Breath sounds: Normal breath sounds.  Neurological:     Mental Status: She is alert and oriented to person, place, and time.     Sensory: No sensory deficit.     Motor: No weakness.     Coordination: Coordination normal.     Gait: Gait normal.  Psychiatric:        Mood and Affect: Mood normal.        Behavior: Behavior normal.     BP (!) 140/95    Pulse (!) 118   Temp (!) 97.5 F (36.4 C) (Temporal)   Ht 5\' 6"  (1.676 m)   Wt 295 lb 2 oz (133.9 kg)   BMI 47.63 kg/m  Wt Readings from Last 3 Encounters:  03/05/21 295 lb 2 oz (133.9 kg)  02/28/21 297 lb (134.7 kg)  01/28/21 (!) 305 lb (138.3 kg)     Health Maintenance Due  Topic Date Due  . Hepatitis C Screening  Never done  . COVID-19 Vaccine (1) Never done  . HIV Screening  Never done    There are no preventive care reminders to display for this patient.  Lab Results  Component Value Date   TSH 1.800 11/04/2020   Lab Results  Component Value Date   WBC 15.7 (H) 02/28/2021   HGB 14.7 02/28/2021   HCT 42.3 02/28/2021   MCV 93.8 02/28/2021   PLT 448 (H) 02/28/2021   Lab Results  Component Value Date   NA 140 02/28/2021   K 3.7 02/28/2021   CO2 22 02/28/2021   GLUCOSE 115 (H) 02/28/2021   BUN <5 (L) 02/28/2021   CREATININE 0.56 02/28/2021   BILITOT 0.3 11/04/2020   ALKPHOS 190 (H) 11/04/2020   AST 29 11/04/2020   ALT 35 (H) 11/04/2020   PROT 6.1 11/04/2020   ALBUMIN 4.2 11/04/2020   CALCIUM 9.2 02/28/2021   ANIONGAP 12 02/28/2021   No results found for: CHOL No results found for: HDL No results found for: LDLCALC No results found for: TRIG No results found for: [facility] Lab Results  Component Value Date   HGBA1C 5.0 11/04/2020      Assessment & Plan:   Debra Copeland was seen today for loss of vision.  Diagnoses and all orders for this visit:  Vision loss Concern for optic neuritis. Referral to neurology placed today. Patient will call opthalmologist (Dr. Rodman Pickle) today to schedule follow up.  -     Ambulatory referral to Neurology  Leukocytosis, unspecified type Elevated WBC in ED. Repeat today.  -     CBC with Differential/Platelet   Follow-up: Return if symptoms worsen or fail to improve.   The patient indicates understanding of these issues and agrees with the plan.     Sherryll Burger, FNP

## 2021-03-07 ENCOUNTER — Encounter: Payer: Self-pay | Admitting: Diagnostic Neuroimaging

## 2021-03-07 ENCOUNTER — Ambulatory Visit: Payer: BC Managed Care – PPO | Admitting: Diagnostic Neuroimaging

## 2021-03-07 ENCOUNTER — Other Ambulatory Visit: Payer: Self-pay

## 2021-03-07 ENCOUNTER — Other Ambulatory Visit: Payer: Self-pay | Admitting: *Deleted

## 2021-03-07 ENCOUNTER — Telehealth: Payer: Self-pay

## 2021-03-07 VITALS — BP 140/78 | HR 89 | Ht 66.0 in | Wt 293.8 lb

## 2021-03-07 DIAGNOSIS — H469 Unspecified optic neuritis: Secondary | ICD-10-CM | POA: Diagnosis not present

## 2021-03-07 DIAGNOSIS — D72828 Other elevated white blood cell count: Secondary | ICD-10-CM

## 2021-03-07 NOTE — Patient Instructions (Signed)
Check labs, visual evoked potential testing, lumbar puncture.

## 2021-03-07 NOTE — Progress Notes (Signed)
[facility]  PATIENT: Debra Copeland DOB: [birth year]  REFERRING CLINICIAN: Renne Crigler, PA-C HISTORY FROM: patient  REASON FOR VISIT: new consult    HISTORICAL  CHIEF COMPLAINT:  Chief Complaint  Patient presents with  . New Patient (Initial Visit)    "Right eye problems, MRI results" Room 7, mom Sabrina in room    HISTORY OF PRESENT ILLNESS:   20 year old female here for evaluation of vision changes.  Around January 17, 2021 patient noticed abnormal vision mainly from her right eye.  She felt that her central vision was decreased in her right eye when she closed her left eye, but she would see "extra brightness" in this region.  No spots or sparkles.  No dark spots or gaps in visual field.  No pain or headache.  Symptoms continued and slightly affected the left eye.  Patient went to ophthalmology on 02/28/2021 and exam was notable for decreased visual field testing in right greater than left eye, mainly inferior fields.  Patient was diagnosed with possible optic neuritis and sent to the ER for evaluation.  MRI of the brain and orbits was obtained.  Very subtle T2 hyperintensity was noted within the bilateral optic chiasm and proximal optic nerves, right greater than left side.  No abnormal enhancement noted.  She was recommended to be admitted for further testing and lumbar puncture but patient wanted to go home and follow-up with outpatient neurology.  Patient has had some numbness and cramps in her hands.  No other prodromal accidents injuries or traumas.   REVIEW OF SYSTEMS: Full 14 system review of systems performed and negative with exception of: As per HPI.  ALLERGIES: No Known Allergies  HOME MEDICATIONS: Outpatient Medications Prior to Visit  Medication Sig Dispense Refill  . omeprazole (PRILOSEC) 20 MG capsule TAKE 1 CAPSULE BY MOUTH ONCE DAILY FOR ACID REFLUX 90 capsule 3  . sertraline (ZOLOFT) 25 MG tablet Take 25 mg by mouth daily.      Facility-Administered Medications Prior to Visit  Medication Dose Route Frequency Provider Last Rate Last Admin  . medroxyPROGESTERone (DEPO-PROVERA) injection 150 mg  150 mg Intramuscular Q90 days Delynn Flavin M, DO   150 mg at 01/28/21 1538    PAST MEDICAL HISTORY: Past Medical History:  Diagnosis Date  . Anxiety   . Depression     PAST SURGICAL HISTORY: No past surgical history on file.  FAMILY HISTORY: Family History  Problem Relation Age of Onset  . Hypertension Father   . Hypothyroidism Father   . Vitamin D deficiency Father   . Bipolar disorder Maternal Uncle   . Bipolar disorder Maternal Grandfather   . Depression Paternal Grandmother     SOCIAL HISTORY: Social History   Socioeconomic History  . Marital status: Single    Spouse name: Not on file  . Number of children: Not on file  . Years of education: Not on file  . Highest education level: Not on file  Occupational History  . Not on file  Tobacco Use  . Smoking status: Never Smoker  . Smokeless tobacco: Never Used  Vaping Use  . Vaping Use: Never used  Substance and Sexual Activity  . Alcohol use: Yes    Comment: occ  . Drug use: Never  . Sexual activity: Not Currently    Birth control/protection: Pill, Injection  Other Topics Concern  . Not on file  Social History Narrative   Lives with mom dad and brother   Left Handed  Drinks 2-3 cups caffeine daily   Social Determinants of Health   Financial Resource Strain: Not on file  Food Insecurity: Not on file  Transportation Needs: Not on file  Physical Activity: Not on file  Stress: Not on file  Social Connections: Not on file  Intimate Partner Violence: Not on file     PHYSICAL EXAM  GENERAL EXAM/CONSTITUTIONAL: Vitals:  Vitals:   03/07/21 0811  BP: 140/78  Pulse: 89  Weight: 293 lb 12.8 oz (133.3 kg)  Height: 5\' 6"  (1.676 m)     Body mass index is 47.42 kg/m. Wt Readings from Last 3 Encounters:  03/07/21 293 lb 12.8  oz (133.3 kg)  03/05/21 295 lb 2 oz (133.9 kg)  02/28/21 297 lb (134.7 kg)     Patient is in no distress; well developed, nourished and groomed; neck is supple  CARDIOVASCULAR:  Examination of carotid arteries is normal; no carotid bruits  Regular rate and rhythm, no murmurs  Examination of peripheral vascular system by observation and palpation is normal  EYES:  Ophthalmoscopic exam of optic discs and posterior segments is normal; no papilledema or hemorrhages  No exam data present  MUSCULOSKELETAL:  Gait, strength, tone, movements noted in Neurologic exam below  NEUROLOGIC: MENTAL STATUS:  No flowsheet data found.  awake, alert, oriented to person, place and time  recent and remote memory intact  normal attention and concentration  language fluent, comprehension intact, naming intact  fund of knowledge appropriate  CRANIAL NERVE:   2nd - no papilledema on fundoscopic exam  2nd, 3rd, 4th, 6th - pupils RIGHT 03/02/21, LEFT , both reactive to light, visual fields full to confrontation, extraocular muscles intact, no nystagmus  5th - facial sensation symmetric  7th - facial strength symmetric  8th - hearing intact  9th - palate elevates symmetrically, uvula midline  11th - shoulder shrug symmetric  12th - tongue protrusion midline  MOTOR:   normal bulk and tone, full strength in the BUE, BLE  SENSORY:   normal and symmetric to light touch, temperature, vibration  COORDINATION:   finger-nose-finger, fine finger movements normal  REFLEXES:   deep tendon reflexes present and symmetric  GAIT/STATION:   narrow based gait; romberg is negative     DIAGNOSTIC DATA (LABS, IMAGING, TESTING) - I reviewed patient records, labs, notes, testing and imaging myself where available.  Lab Results  Component Value Date   WBC 12.4 (H) 03/05/2021   HGB 14.1 03/05/2021   HCT 41.4 03/05/2021   MCV 97 03/05/2021   PLT 444 03/05/2021      Component  Value Date/Time   NA 140 02/28/2021 1805   NA 141 11/04/2020 1449   K 3.7 02/28/2021 1805   CL 106 02/28/2021 1805   CO2 22 02/28/2021 1805   GLUCOSE 115 (H) 02/28/2021 1805   BUN <5 (L) 02/28/2021 1805   BUN 5 (L) 11/04/2020 1449   CREATININE 0.56 02/28/2021 1805   CALCIUM 9.2 02/28/2021 1805   PROT 6.1 11/04/2020 1449   ALBUMIN 4.2 11/04/2020 1449   AST 29 11/04/2020 1449   ALT 35 (H) 11/04/2020 1449   ALKPHOS 190 (H) 11/04/2020 1449   BILITOT 0.3 11/04/2020 1449   GFRNONAA >60 02/28/2021 1805   GFRAA 160 11/04/2020 1449   No results found for: CHOL, HDL, LDLCALC, LDLDIRECT, TRIG, CHOLHDL Lab Results  Component Value Date   HGBA1C 5.0 11/04/2020   No results found for: 11/06/2020 Lab Results  Component Value Date   TSH 1.800 11/04/2020  02/28/21 MRI brain / orbits [I reviewed images myself and agree with interpretation. -VRP]  1. No acute intracranial abnormality. 2. Questionable hyperintense T2-weighted signal within the cisternal segment of the right optic nerve, equivocal for optic neuritis. 3. No evidence of intracranial demyelinating disease.    ASSESSMENT AND PLAN  20 y.o. year old female here with abnormal vision changes in right greater than left eye, abnormal visual field testing, with subtle abnormalities on MRI orbits.  May represent autoimmune inflammatory, demyelinating or postinfectious etiologies.  We will proceed with further work-up  Dx:  1. Optic neuritis      PLAN:  Right eye vision changes - check LP for opening pressure and autoimmune / inflamm eval (including IgG index and OCB) - check visual evoked potential  - check labs - may consider repeat MRI orbits, and MRI cervical / thoracic spinal cord in future  Orders Placed This Encounter  Procedures  . DG FL GUIDED LUMBAR PUNCTURE  . Neuromyelitis optica autoab, IgG  . ANA,IFA RA Diag Pnl w/rflx Tit/Patn  . C-reactive protein-CRP (Quant.)  . Sedimentation rate  . Vitamin B12  .  TSH  . CBC with Differential/Platelet  . Comprehensive metabolic panel  . Pan-ANCA  . Angiotensin converting enzyme  . SPEP with IFE  . HIV  . RPR  . Vitamin B1  . Vitamin B6  . Visual evoked potential test   Return in about 6 weeks (around 04/18/2021), or pending testing results, for pending if symptoms worsen or fail to improve.    Suanne Marker, MD 03/07/2021, 8:44 AM Certified in Neurology, Neurophysiology and Neuroimaging  [facility] [address], [address] [place], [place] [address] [phone]

## 2021-03-07 NOTE — Telephone Encounter (Signed)
Medical record release has been faxed to [facility]  Fax # [phone]. Confirmation received

## 2021-03-11 ENCOUNTER — Ambulatory Visit: Payer: BC Managed Care – PPO | Admitting: Neurology

## 2021-03-11 ENCOUNTER — Ambulatory Visit
Admission: RE | Admit: 2021-03-11 | Discharge: 2021-03-11 | Disposition: A | Discharge status: Home / Self Care | Payer: BC Managed Care – PPO | Source: Ambulatory Visit | Attending: Diagnostic Neuroimaging | Admitting: Diagnostic Neuroimaging

## 2021-03-11 ENCOUNTER — Other Ambulatory Visit: Payer: Self-pay

## 2021-03-11 VITALS — BP 156/109 | HR 111

## 2021-03-11 DIAGNOSIS — H469 Unspecified optic neuritis: Secondary | ICD-10-CM

## 2021-03-11 NOTE — Discharge Instructions (Signed)
Lumbar Puncture Discharge Instructions  1. Go home and rest quietly as needed. You may resume normal activities; however, do not exert yourself strongly or do any heavy lifting today and tomorrow.   2. DO NOT drive today.    3. You may resume your normal diet and medications unless otherwise indicated. Drink lots of extra fluids today and tomorrow.   4. The incidence of headache, nausea, or vomiting is about 5% (one in 20 patients).  If you develop a headache, lie flat for 24 hours and drink plenty of fluids until the headache goes away.  Caffeinated beverages may be helpful. If when you get up you still have a headache when standing, go back to bed and force fluids for another 24 hours.   5. If you develop severe nausea and vomiting or a headache that does not go away with the flat bedrest after 48 hours, please call [phone].   6. Call your physician for a follow-up appointment.  The results of your Lumbar Puncture will be sent directly to your physician and they will contact you.   7. If you have any questions or if complications develop after you arrive home, please call [phone].  Discharge instructions have been explained to the patient.  The patient, or the person responsible for the patient, fully understands these instructions.   Thank you for visiting our office today.  

## 2021-03-11 NOTE — Progress Notes (Signed)
Blood drawn from pts LAC. 1 tube to be sent with LP lab work. 1 successful attempt. Pt tolerated well. Gauze and tape applied after.

## 2021-03-11 NOTE — Progress Notes (Signed)
   Study date 03/12/2019. Name:  Debra Copeland DOB:  2001-04-08 Physician: Dr. Marjory Lies   History:  20 year old woman with right-sided visual changes.  Description:  The visual evoked response test was performed today .   The absolute latencies for the P100 wave forms were 145 ms OS and nonresponsive OD. The amplitudes for the P100 wave forms were reduced.   The visual acuity was handwaving OD and 20/70 OS corrected.  Impression:  The evoked response was mildly to moderately prolonged on the left, implying mild to moderate slowing along the left visual pathway.  The evoked response was absent on the right, implying severe slowing along the right visual pathways.   Debra Canedo A. Epimenio Foot, MD, PhD, FAAN Certified in Neurology, Clinical Neurophysiology, Sleep Medicine, Pain Medicine and Neuroimaging Director, [facility]  [facility] [address] [place], [place] [address] [phone]

## 2021-03-12 NOTE — Telephone Encounter (Signed)
Received records form [employer], placed on MD desk for review.

## 2021-03-17 ENCOUNTER — Telehealth: Payer: Self-pay | Admitting: *Deleted

## 2021-03-17 DIAGNOSIS — H469 Unspecified optic neuritis: Secondary | ICD-10-CM

## 2021-03-17 NOTE — Telephone Encounter (Signed)
Very low B12 level needs replacement. Other labs ok; some pending. -VRP

## 2021-03-17 NOTE — Telephone Encounter (Signed)
Called mother, on [place] and informed her the labs show low B12. Advised she start OTC B12 1000 mg and contact PCP for ongoing management. Informed her some labs are pending.  Informed her per Dr Marjory Lies the vep (evoked potentials) are abnormal, but that is related to her optic neuritis. Mother stated she is not sleeping well, has headache off and on, taking Motrin for it with relief. I advised patient stay well hydrated, may try some caffeine, take OTC for pain as needed, try to rest.  Mother  verbalized understanding, appreciation.

## 2021-03-19 LAB — CSF CULTURE W GRAM STAIN
MICRO NUMBER:: 11843483
Result:: NO GROWTH
SPECIMEN QUALITY:: ADEQUATE

## 2021-03-19 LAB — CSF CELL COUNT WITH DIFFERENTIAL
Basophils, %: 0 %
Lymphs, CSF: 93 % — ABNORMAL HIGH (ref 40–80)
Monocyte/Macrophage: 7 % — ABNORMAL LOW (ref 15–45)
RBC Count, CSF: 0 cells/uL
Segmented Neutrophils-CSF: 0 % (ref 0–6)
WBC, CSF: 13 cells/uL — ABNORMAL HIGH (ref 0–5)

## 2021-03-19 LAB — OLIGOCLONAL BANDS, CSF + SERM

## 2021-03-19 LAB — GLUCOSE, CSF: Glucose, CSF: 80 mg/dL (ref 40–80)

## 2021-03-19 LAB — PROTEIN, CSF: Total Protein, CSF: 27 mg/dL (ref 15–45)

## 2021-03-19 LAB — CNS IGG SYNTHESIS RATE, CSF+BLOOD
Albumin Serum: 4.7 g/dL (ref 3.5–5.2)
Albumin, CSF: 12.4 mg/dL (ref 8.0–42.0)
CNS-IgG Synthesis Rate: 5.1 mg/24 h — ABNORMAL HIGH (ref <=3.3)
IgG (Immunoglobin G), Serum: 600 mg/dL (ref 600–1640)
IgG Total CSF: 2.2 mg/dL (ref 0.8–7.7)
IgG-Index: 1.39 — ABNORMAL HIGH (ref <0.66)

## 2021-03-19 NOTE — Telephone Encounter (Signed)
Called mother and informed her of Dr Richrd Humbles CSF lab result note, answered her questions to her stated satisfaction. I advised she'll get a call to schedule MRIs after insurance approval. Mother verbalized understanding, appreciation.

## 2021-03-19 NOTE — Addendum Note (Signed)
Addended by: Joycelyn Schmid R on: 03/19/2021 09:38 AM   Modules accepted: Orders

## 2021-03-19 NOTE — Telephone Encounter (Signed)
Received final lab results from [facility] re: CSF. Placed on MD desk for review.

## 2021-03-19 NOTE — Telephone Encounter (Signed)
CSF studies show OCBs and increased IgG index. These indicate inflammation in the CNS. Will check MRI cervical and thoracic spine as next step.   Orders Placed This Encounter  Procedures  . MR CERVICAL SPINE W WO CONTRAST  . MR THORACIC SPINE W WO CONTRAST   Suanne Marker, MD 03/19/2021, 9:38 AM Certified in Neurology, Neurophysiology and Neuroimaging  [facility] [address], [address] [place], [place] [address] [phone]

## 2021-03-20 ENCOUNTER — Telehealth: Payer: Self-pay | Admitting: Diagnostic Neuroimaging

## 2021-03-20 LAB — MULTIPLE MYELOMA PANEL, SERUM
Albumin SerPl Elph-Mcnc: 4 g/dL (ref 2.9–4.4)
Albumin/Glob SerPl: 1.5 (ref 0.7–1.7)
Alpha 1: 0.3 g/dL (ref 0.0–0.4)
Alpha2 Glob SerPl Elph-Mcnc: 0.9 g/dL (ref 0.4–1.0)
B-Globulin SerPl Elph-Mcnc: 1 g/dL (ref 0.7–1.3)
Gamma Glob SerPl Elph-Mcnc: 0.5 g/dL (ref 0.4–1.8)
Globulin, Total: 2.7 g/dL (ref 2.2–3.9)
IgA/Immunoglobulin A, Serum: 85 mg/dL — ABNORMAL LOW (ref 87–352)
IgG (Immunoglobin G), Serum: 526 mg/dL — ABNORMAL LOW (ref 586–1602)
IgM (Immunoglobulin M), Srm: 66 mg/dL (ref 26–217)

## 2021-03-20 LAB — CBC WITH DIFFERENTIAL/PLATELET
Basophils Absolute: 0.1 10*3/uL (ref 0.0–0.2)
Basos: 0 %
EOS (ABSOLUTE): 0.1 10*3/uL (ref 0.0–0.4)
Eos: 1 %
Hematocrit: 42.1 % (ref 34.0–46.6)
Hemoglobin: 14.3 g/dL (ref 11.1–15.9)
Immature Grans (Abs): 0.1 10*3/uL (ref 0.0–0.1)
Immature Granulocytes: 1 %
Lymphocytes Absolute: 2.3 10*3/uL (ref 0.7–3.1)
Lymphs: 17 %
MCH: 32.6 pg (ref 26.6–33.0)
MCHC: 34 g/dL (ref 31.5–35.7)
MCV: 96 fL (ref 79–97)
Monocytes Absolute: 0.7 10*3/uL (ref 0.1–0.9)
Monocytes: 5 %
Neutrophils Absolute: 10.6 10*3/uL — ABNORMAL HIGH (ref 1.4–7.0)
Neutrophils: 76 %
Platelets: 431 10*3/uL (ref 150–450)
RBC: 4.38 x10E6/uL (ref 3.77–5.28)
RDW: 18.1 % — ABNORMAL HIGH (ref 11.7–15.4)
WBC: 13.8 10*3/uL — ABNORMAL HIGH (ref 3.4–10.8)

## 2021-03-20 LAB — COMPREHENSIVE METABOLIC PANEL
ALT: 48 IU/L — ABNORMAL HIGH (ref 0–32)
AST: 38 IU/L (ref 0–40)
Albumin/Globulin Ratio: 2.9 — ABNORMAL HIGH (ref 1.2–2.2)
Albumin: 5 g/dL (ref 3.9–5.0)
Alkaline Phosphatase: 158 IU/L — ABNORMAL HIGH (ref 42–106)
BUN/Creatinine Ratio: 5 — ABNORMAL LOW (ref 9–23)
BUN: 3 mg/dL — ABNORMAL LOW (ref 6–20)
Bilirubin Total: 0.7 mg/dL (ref 0.0–1.2)
CO2: 24 mmol/L (ref 20–29)
Calcium: 9.3 mg/dL (ref 8.7–10.2)
Chloride: 101 mmol/L (ref 96–106)
Creatinine, Ser: 0.56 mg/dL — ABNORMAL LOW (ref 0.57–1.00)
Globulin, Total: 1.7 g/dL (ref 1.5–4.5)
Glucose: 110 mg/dL — ABNORMAL HIGH (ref 65–99)
Potassium: 4 mmol/L (ref 3.5–5.2)
Sodium: 141 mmol/L (ref 134–144)
Total Protein: 6.7 g/dL (ref 6.0–8.5)
eGFR: 134 mL/min/{1.73_m2} (ref >59)

## 2021-03-20 LAB — ANA,IFA RA DIAG PNL W/RFLX TIT/PATN
ANA Titer 1: NEGATIVE
Cyclic Citrullin Peptide Ab: 4 units (ref 0–19)
Rheumatoid fact SerPl-aCnc: <10 IU/mL (ref <14.0)

## 2021-03-20 LAB — PAN-ANCA
ANCA Proteinase 3: <3.5 U/mL (ref 0.0–3.5)
Atypical pANCA: <1:20 {titer}
C-ANCA: <1:20 {titer}
Myeloperoxidase Ab: <9 U/mL (ref 0.0–9.0)
P-ANCA: <1:20 {titer}

## 2021-03-20 LAB — NEUROMYELITIS OPTICA AUTOAB, IGG: NMO IgG Autoantibodies: <1.5 U/mL (ref 0.0–3.0)

## 2021-03-20 LAB — VITAMIN B6: Vitamin B6: 2.9 ug/L — ABNORMAL LOW (ref 3.4–65.2)

## 2021-03-20 LAB — HIV ANTIBODY (ROUTINE TESTING W REFLEX): HIV Screen 4th Generation wRfx: NONREACTIVE

## 2021-03-20 LAB — ANGIOTENSIN CONVERTING ENZYME: Angio Convert Enzyme: 30 U/L (ref 14–82)

## 2021-03-20 LAB — TSH: TSH: 1.33 u[IU]/mL (ref 0.450–4.500)

## 2021-03-20 LAB — SEDIMENTATION RATE: Sed Rate: 16 mm/hr (ref 0–32)

## 2021-03-20 LAB — VITAMIN B12: Vitamin B-12: 87 pg/mL — ABNORMAL LOW (ref 232–1245)

## 2021-03-20 LAB — RPR: RPR Ser Ql: NONREACTIVE

## 2021-03-20 LAB — C-REACTIVE PROTEIN: CRP: 22 mg/L — ABNORMAL HIGH (ref 0–10)

## 2021-03-20 LAB — VITAMIN B1: Thiamine: 152.7 nmol/L (ref 66.5–200.0)

## 2021-03-20 NOTE — Telephone Encounter (Signed)
90 mins MR cervical & thoracic spine dr. Marjory Lies no to covid questions [employer] auth # [id number] (exp. 03/20/21-09/15/21) JM  Scheduled at [facility]

## 2021-03-26 ENCOUNTER — Ambulatory Visit: Payer: BC Managed Care – PPO

## 2021-03-26 DIAGNOSIS — H469 Unspecified optic neuritis: Secondary | ICD-10-CM

## 2021-03-26 MED ORDER — GADOBENATE DIMEGLUMINE 529 MG/ML IV SOLN
20.0000 mL | Freq: Once | INTRAVENOUS | Status: AC | PRN
  Administered 2021-03-26: 20 mL via INTRAVENOUS

## 2021-03-31 ENCOUNTER — Telehealth: Payer: Self-pay | Admitting: *Deleted

## 2021-03-31 ENCOUNTER — Telehealth: Payer: Self-pay | Admitting: Neurology

## 2021-03-31 NOTE — Telephone Encounter (Signed)
Called mother and scheduled a FU to discuss all results an d next steps for patient. She had no questions,  verbalized understanding, appreciation.

## 2021-03-31 NOTE — Telephone Encounter (Signed)
I called the patient.  I discussed the MRI results of the brain and cervical spine, fully consistent with multiple sclerosis.  Lumbar puncture was done, the patient had greater than 5 oligoclonal bands.  NMO antibodies were negative.   The patient will need an appointment to discuss treatments for multiple sclerosis.

## 2021-04-01 NOTE — Telephone Encounter (Signed)
Thank you. Please setup virtual visit with me this Thursday on in office next week with me. -VRP

## 2021-04-09 ENCOUNTER — Ambulatory Visit: Payer: BC Managed Care – PPO | Admitting: Diagnostic Neuroimaging

## 2021-04-09 ENCOUNTER — Other Ambulatory Visit: Payer: Self-pay

## 2021-04-09 ENCOUNTER — Encounter: Payer: Self-pay | Admitting: Diagnostic Neuroimaging

## 2021-04-09 VITALS — BP 133/92 | HR 105 | Ht 66.0 in | Wt 296.0 lb

## 2021-04-09 DIAGNOSIS — H469 Unspecified optic neuritis: Secondary | ICD-10-CM | POA: Diagnosis not present

## 2021-04-09 DIAGNOSIS — G35 Multiple sclerosis: Secondary | ICD-10-CM | POA: Diagnosis not present

## 2021-04-09 MED ORDER — PREDNISONE 10 MG PO TABS: ORAL_TABLET | ORAL | 0 refills | Status: DC

## 2021-04-09 NOTE — Progress Notes (Addendum)
Vumerity start form signed, placed on Kristen, RN MS coordinator's desk to be held until lab results are in. JCV lab specimen placed in [facility] lock box for pick up.

## 2021-04-09 NOTE — Patient Instructions (Signed)
Demyelinating disease (multiple sclerosis) - check JCV ab and other labs - consider tecfidera, vumerity, tysabri, ocrevus - prednisone course (to see if vision healing can improve)

## 2021-04-09 NOTE — Progress Notes (Signed)
[facility]  PATIENT: Debra Copeland DOB: 2001-07-20  REFERRING CLINICIAN: Raliegh Ip, DO HISTORY FROM: patient  REASON FOR VISIT: follow up    HISTORICAL  CHIEF COMPLAINT:  Chief Complaint  Patient presents with  . Optic neuritis    Rm 7 mom- Sabrina, New MS diagnosis, discuss results, next steps    HISTORY OF PRESENT ILLNESS:   UPDATE (04/09/21, VRP): Since last visit, doing a little better. Sxs worse with heat or activity. Some mild vision changes and numbness in hands. No other alleviating or aggravating factors.     PRIOR HPI: 20 year old female here for evaluation of vision changes.  Around January 17, 2021 patient noticed abnormal vision mainly from her right eye.  She felt that her central vision was decreased in her right eye when she closed her left eye, but she would see "extra brightness" in this region.  No spots or sparkles.  No dark spots or gaps in visual field.  No pain or headache.  Symptoms continued and slightly affected the left eye.  Patient went to ophthalmology on 02/28/2021 and exam was notable for decreased visual field testing in right greater than left eye, mainly inferior fields.  Patient was diagnosed with possible optic neuritis and sent to the ER for evaluation.  MRI of the brain and orbits was obtained.  Very subtle T2 hyperintensity was noted within the bilateral optic chiasm and proximal optic nerves, right greater than left side.  No abnormal enhancement noted.  She was recommended to be admitted for further testing and lumbar puncture but patient wanted to go home and follow-up with outpatient neurology.  Patient has had some numbness and cramps in her hands.  No other prodromal accidents injuries or traumas.   REVIEW OF SYSTEMS: Full 14 system review of systems performed and negative with exception of: As per HPI.  ALLERGIES: No Known Allergies  HOME MEDICATIONS: Outpatient Medications Prior to Visit  Medication  Sig Dispense Refill  . omeprazole (PRILOSEC) 20 MG capsule TAKE 1 CAPSULE BY MOUTH ONCE DAILY FOR ACID REFLUX 90 capsule 3  . sertraline (ZOLOFT) 25 MG tablet Take 25 mg by mouth daily.     Facility-Administered Medications Prior to Visit  Medication Dose Route Frequency Provider Last Rate Last Admin  . medroxyPROGESTERone (DEPO-PROVERA) injection 150 mg  150 mg Intramuscular Q90 days Delynn Flavin M, DO   150 mg at 01/28/21 1538    PAST MEDICAL HISTORY: Past Medical History:  Diagnosis Date  . Anxiety   . Depression     PAST SURGICAL HISTORY: No past surgical history on file.  FAMILY HISTORY: Family History  Problem Relation Age of Onset  . Hypertension Father   . Hypothyroidism Father   . Vitamin D deficiency Father   . Bipolar disorder Maternal Uncle   . Bipolar disorder Maternal Grandfather   . Depression Paternal Grandmother     SOCIAL HISTORY: Social History   Socioeconomic History  . Marital status: Single    Spouse name: Not on file  . Number of children: Not on file  . Years of education: Not on file  . Highest education level: Not on file  Occupational History  . Not on file  Tobacco Use  . Smoking status: Never Smoker  . Smokeless tobacco: Never Used  Vaping Use  . Vaping Use: Never used  Substance and Sexual Activity  . Alcohol use: Yes    Comment: occ  . Drug use: Never  . Sexual activity: Not  Currently    Birth control/protection: Pill, Injection  Other Topics Concern  . Not on file  Social History Narrative   Lives with mom dad and brother   Left Handed   Drinks 2-3 cups caffeine daily   Social Determinants of Health   Financial Resource Strain: Not on file  Food Insecurity: Not on file  Transportation Needs: Not on file  Physical Activity: Not on file  Stress: Not on file  Social Connections: Not on file  Intimate Partner Violence: Not on file     PHYSICAL EXAM  GENERAL EXAM/CONSTITUTIONAL: Vitals:  Vitals:   04/09/21  1447  BP: (!) 133/92  Pulse: (!) 105  Weight: 296 lb (134.3 kg)  Height: 5\' 6"  (1.676 m)   Body mass index is 47.78 kg/m. Wt Readings from Last 3 Encounters:  04/09/21 296 lb (134.3 kg)  03/07/21 293 lb 12.8 oz (133.3 kg)  03/05/21 295 lb 2 oz (133.9 kg)    Patient is in no distress; well developed, nourished and groomed; neck is supple  CARDIOVASCULAR:  Examination of carotid arteries is normal; no carotid bruits  Regular rate and rhythm, no murmurs  Examination of peripheral vascular system by observation and palpation is normal  EYES:  Ophthalmoscopic exam of optic discs and posterior segments is normal; no papilledema or hemorrhages No exam data present  MUSCULOSKELETAL:  Gait, strength, tone, movements noted in Neurologic exam below  NEUROLOGIC: MENTAL STATUS:  No flowsheet data found.  awake, alert, oriented to person, place and time  recent and remote memory intact  normal attention and concentration  language fluent, comprehension intact, naming intact  fund of knowledge appropriate  CRANIAL NERVE:   2nd - no papilledema on fundoscopic exam  2nd, 3rd, 4th, 6th - right RAPD; pupils RIGHT 03/07/21, LEFT , both reactive to light, visual fields full to confrontation, extraocular muscles intact, no nystagmus  5th - facial sensation symmetric  7th - facial strength symmetric  8th - hearing intact  9th - palate elevates symmetrically, uvula midline  11th - shoulder shrug symmetric  12th - tongue protrusion midline  MOTOR:   normal bulk and tone, full strength in the BUE, BLE  SENSORY:   normal and symmetric to light touch, temperature, vibration  COORDINATION:   finger-nose-finger, fine finger movements normal  REFLEXES:   deep tendon reflexes present and symmetric  GAIT/STATION:   narrow based gait; romberg is negative     DIAGNOSTIC DATA (LABS, IMAGING, TESTING) - I reviewed patient records, labs, notes, testing and imaging  myself where available.  Lab Results  Component Value Date   WBC 13.8 (H) 03/07/2021   HGB 14.3 03/07/2021   HCT 42.1 03/07/2021   MCV 96 03/07/2021   PLT 431 03/07/2021      Component Value Date/Time   NA 141 03/07/2021 1000   K 4.0 03/07/2021 1000   CL 101 03/07/2021 1000   CO2 24 03/07/2021 1000   GLUCOSE 110 (H) 03/07/2021 1000   GLUCOSE 115 (H) 02/28/2021 1805   BUN 3 (L) 03/07/2021 1000   CREATININE 0.56 (L) 03/07/2021 1000   CALCIUM 9.3 03/07/2021 1000   PROT 6.7 03/07/2021 1000   ALBUMIN 4.7 03/11/2021 1455   AST 38 03/07/2021 1000   ALT 48 (H) 03/07/2021 1000   ALKPHOS 158 (H) 03/07/2021 1000   BILITOT 0.7 03/07/2021 1000   GFRNONAA >60 02/28/2021 1805   GFRAA 160 11/04/2020 1449   No results found for: CHOL, HDL, LDLCALC, LDLDIRECT, TRIG, CHOLHDL Lab  Results  Component Value Date   HGBA1C 5.0 11/04/2020   Lab Results  Component Value Date   VITAMINB12 87 (L) 03/07/2021   Lab Results  Component Value Date   TSH 1.330 03/07/2021    02/28/21 MRI brain / orbits [I reviewed images myself and agree with interpretation. -VRP]  1. No acute intracranial abnormality. 2. Questionable hyperintense T2-weighted signal within the cisternal segment of the right optic nerve, equivocal for optic neuritis. 3. No evidence of intracranial demyelinating disease.  03/26/21 MRI cervical spine with and without contrast imaging: -Chronic demyelinating plaques at C3-4 anterior and centrally, and subtle at C5 on the right and subtle C6-7 on the left.   03/26/21  Normal MRI thoracic spine with without contrast.  03/11/21 VEP - The evoked response was mildly to moderately prolonged on the left, implying mild to moderate slowing along the left visual pathway.  The evoked response was absent on the right, implying severe slowing along the right visual pathways.   03/11/21 CSF STUDIES  WBC 13, RBC 0, PROT 27, GLUC 80   Component Ref Range & Units 4 wk ago 1 mo ago  CNS-IgG  Synthesis Rate -9.9 - 3.3 mg/24 h 5.1High    IgG-Index <0.66 1.39High     OCB - The patient's CSF contains >5 well defined gamma  restriction bands that are not present in the patient's  corresponding serum sample. These bands indicate  abnormal synthesis of gammaglobulins in the central  nervous system. This finding is supportive evidence  of central nervous system inflammation, multiple  sclerosis, or infection and should be interpreted in  conjunction with all clinical and laboratory data  pertaining to this patient.   03/07/21 labs - NMO, ANA, ANCA, HIV, RPR ACE --> NEGATIVE    ASSESSMENT AND PLAN  20 y.o. year old female here with abnormal vision changes in right greater than left eye, abnormal visual field testing, with subtle abnormalities on MRI orbits. Also abnl VEP, MRI cervical spine and LP results. Consistent with demyelinating disease.   Dx:  1. Optic neuritis   2. Multiple sclerosis (HCC)     PLAN:  Demyelinating disease (multiple sclerosis) - check JCV ab and other labs - consider vumerity, tysabri, ocrevus - prednisone course (to see if vision healing can improve)   Orders Placed This Encounter  Procedures  . Stratify JCV(TM) Ab w/Index  . Immunoglobulins, QN, A/E/G/M  . Hepatitis B surface antibody,qualitative  . Hepatitis B core antibody, total  . QuantiFERON-TB Gold Plus  . Hepatitis B surface antigen   Return in about 3 months (around 07/10/2021).    Suanne Marker, MD 04/09/2021, 2:54 PM Certified in Neurology, Neurophysiology and Neuroimaging  [facility] [address] [place], [place] [address] [phone]

## 2021-04-15 ENCOUNTER — Telehealth: Payer: Self-pay | Admitting: Family Medicine

## 2021-04-15 ENCOUNTER — Other Ambulatory Visit: Payer: Self-pay | Admitting: Family Medicine

## 2021-04-15 ENCOUNTER — Ambulatory Visit: Payer: BC Managed Care – PPO

## 2021-04-15 ENCOUNTER — Telehealth: Payer: Self-pay | Admitting: *Deleted

## 2021-04-15 ENCOUNTER — Encounter: Payer: Self-pay | Admitting: Family Medicine

## 2021-04-15 DIAGNOSIS — Z3042 Encounter for surveillance of injectable contraceptive: Secondary | ICD-10-CM

## 2021-04-15 LAB — IMMUNOGLOBULINS A/E/G/M, SERUM
IgA/Immunoglobulin A, Serum: 74 mg/dL — ABNORMAL LOW (ref 87–352)
IgE (Immunoglobulin E), Serum: 5 IU/mL — ABNORMAL LOW (ref 6–495)
IgG (Immunoglobin G), Serum: 438 mg/dL — ABNORMAL LOW (ref 586–1602)
IgM (Immunoglobulin M), Srm: 51 mg/dL (ref 26–217)

## 2021-04-15 LAB — VITAMIN D 25 HYDROXY (VIT D DEFICIENCY, FRACTURES): Vit D, 25-Hydroxy: 11 ng/mL — ABNORMAL LOW (ref 30.0–100.0)

## 2021-04-15 LAB — QUANTIFERON-TB GOLD PLUS
QuantiFERON Mitogen Value: >10 IU/mL
QuantiFERON Nil Value: 0 IU/mL
QuantiFERON TB1 Ag Value: 0.04 IU/mL
QuantiFERON TB2 Ag Value: 0 IU/mL
QuantiFERON-TB Gold Plus: NEGATIVE

## 2021-04-15 LAB — HEPATITIS B SURFACE ANTIBODY,QUALITATIVE: Hep B Surface Ab, Qual: NONREACTIVE

## 2021-04-15 LAB — HEPATITIS B SURFACE ANTIGEN: Hepatitis B Surface Ag: NEGATIVE

## 2021-04-15 LAB — VITAMIN B12: Vitamin B-12: 399 pg/mL (ref 232–1245)

## 2021-04-15 LAB — HEPATITIS B CORE ANTIBODY, TOTAL: Hep B Core Total Ab: NEGATIVE

## 2021-04-15 MED ORDER — MEDROXYPROGESTERONE ACETATE 150 MG/ML IM SUSP
150.0000 mg | INTRAMUSCULAR | 1 refills | Status: DC
Start: 2021-04-15 — End: 2021-07-01

## 2021-04-15 NOTE — Telephone Encounter (Signed)
error 

## 2021-04-15 NOTE — Telephone Encounter (Signed)
Pt's mom thinks they have lost one of the depo shots and need another called in before they can make an appt to come in

## 2021-04-15 NOTE — Telephone Encounter (Signed)
Rx sent 

## 2021-04-17 NOTE — Telephone Encounter (Signed)
Called mom, on DPR and informed her the patient's labs show a low vit D. Dr Marjory Lies advised she start vitamin D 2000 units daily. Other labs are ok. JCV is pending. Mom has already started her on Vit D and will check dose. She verbalized understanding, appreciation.

## 2021-04-18 ENCOUNTER — Other Ambulatory Visit: Payer: Self-pay

## 2021-04-18 ENCOUNTER — Ambulatory Visit (INDEPENDENT_AMBULATORY_CARE_PROVIDER_SITE_OTHER): Payer: BC Managed Care – PPO

## 2021-04-18 DIAGNOSIS — D72828 Other elevated white blood cell count: Secondary | ICD-10-CM

## 2021-04-18 DIAGNOSIS — Z3041 Encounter for surveillance of contraceptive pills: Secondary | ICD-10-CM

## 2021-04-18 DIAGNOSIS — Z3042 Encounter for surveillance of injectable contraceptive: Secondary | ICD-10-CM

## 2021-04-18 NOTE — Progress Notes (Signed)
Medroxyprogesterone given to patient and tolerated well.  

## 2021-04-19 LAB — CBC WITH DIFFERENTIAL/PLATELET
Basophils Absolute: 0.1 10*3/uL (ref 0.0–0.2)
Basos: 1 %
EOS (ABSOLUTE): 0.2 10*3/uL (ref 0.0–0.4)
Eos: 2 %
Hematocrit: 40.7 % (ref 34.0–46.6)
Hemoglobin: 13.9 g/dL (ref 11.1–15.9)
Immature Grans (Abs): 0.1 10*3/uL (ref 0.0–0.1)
Immature Granulocytes: 1 %
Lymphocytes Absolute: 2.6 10*3/uL (ref 0.7–3.1)
Lymphs: 21 %
MCH: 33.3 pg — ABNORMAL HIGH (ref 26.6–33.0)
MCHC: 34.2 g/dL (ref 31.5–35.7)
MCV: 98 fL — ABNORMAL HIGH (ref 79–97)
Monocytes Absolute: 0.9 10*3/uL (ref 0.1–0.9)
Monocytes: 8 %
Neutrophils Absolute: 8.3 10*3/uL — ABNORMAL HIGH (ref 1.4–7.0)
Neutrophils: 67 %
Platelets: 413 10*3/uL (ref 150–450)
RBC: 4.17 x10E6/uL (ref 3.77–5.28)
RDW: 14.3 % (ref 11.7–15.4)
WBC: 12.2 10*3/uL — ABNORMAL HIGH (ref 3.4–10.8)

## 2021-04-21 ENCOUNTER — Telehealth: Payer: Self-pay | Admitting: *Deleted

## 2021-04-21 NOTE — Telephone Encounter (Signed)
JCV final result negative 0.21. Placed on MD desk.

## 2021-04-21 NOTE — Telephone Encounter (Signed)
I spoke with patient and mother. We will start tysabri infusion. JCV negative.   Suanne Marker, MD 04/21/2021, 5:49 PM Certified in Neurology, Neurophysiology and Neuroimaging  [facility] [address] [place], [place] [address] [phone]

## 2021-04-22 NOTE — Telephone Encounter (Signed)
I called patient and patient's mother to discuss starting Tysabri.  No answer, voicemail was full, will try again later.

## 2021-04-28 NOTE — Telephone Encounter (Signed)
Tysabri start form and order given to infusion suite.

## 2021-04-28 NOTE — Telephone Encounter (Signed)
I called patient's mother, Martie Lee, per DPR.  I discussed patient starting Tysabri.  They asked that the Tysabri start form be sent to patient's MyChart account.  I will scan it back to me.  Patient's mother had no further questions.

## 2021-05-07 ENCOUNTER — Encounter: Payer: Self-pay | Admitting: Family Medicine

## 2021-05-07 ENCOUNTER — Other Ambulatory Visit: Payer: Self-pay | Admitting: Family Medicine

## 2021-05-07 DIAGNOSIS — F32A Depression, unspecified: Secondary | ICD-10-CM

## 2021-05-07 DIAGNOSIS — F411 Generalized anxiety disorder: Secondary | ICD-10-CM

## 2021-05-07 MED ORDER — SERTRALINE HCL 50 MG PO TABS: 50.0000 mg | ORAL_TABLET | Freq: Every day | ORAL | 1 refills | Status: DC

## 2021-05-09 ENCOUNTER — Ambulatory Visit: Payer: BC Managed Care – PPO | Admitting: Family Medicine

## 2021-05-09 ENCOUNTER — Encounter: Payer: Self-pay | Admitting: Family Medicine

## 2021-05-09 ENCOUNTER — Other Ambulatory Visit: Payer: Self-pay

## 2021-05-09 VITALS — BP 131/89 | HR 119 | Temp 98.0°F | Ht 66.0 in | Wt 296.1 lb

## 2021-05-09 DIAGNOSIS — F329 Major depressive disorder, single episode, unspecified: Secondary | ICD-10-CM | POA: Diagnosis not present

## 2021-05-09 DIAGNOSIS — F411 Generalized anxiety disorder: Secondary | ICD-10-CM | POA: Diagnosis not present

## 2021-05-09 DIAGNOSIS — F32A Depression, unspecified: Secondary | ICD-10-CM

## 2021-05-09 MED ORDER — CITALOPRAM HYDROBROMIDE 20 MG PO TABS
20.0000 mg | ORAL_TABLET | Freq: Every day | ORAL | 5 refills | Status: DC
Start: 2021-05-09 — End: 2021-07-01

## 2021-05-09 MED ORDER — HYDROXYZINE HCL 10 MG PO TABS: 10.0000 mg | ORAL_TABLET | Freq: Three times a day (TID) | ORAL | 0 refills | Status: DC | PRN

## 2021-05-09 NOTE — Patient Instructions (Signed)

## 2021-05-09 NOTE — Progress Notes (Signed)
Acute Office Visit  Subjective:    Patient ID: Debra Copeland, female    DOB: 2001-03-21, 20 y.o.   MRN: [id number]  Chief Complaint  Patient presents with   Anxiety    HPI Patient is in today for anxiety. She reports that her anxiety symptoms have worsened lately. She is currently take zoloft 50 mg. She doesn't feel like it has been helpful. She also had difficulty sleeping and crying spells when she first started this. She has also tried and failed prozac, buspar, and wellbutrin in the past. She would prefer to try another medication instead of zoloft. She is not having panic attacks. She feels anxious all the time and constantly worries about the future. She has recently been diagnosed with MS and this has been stressful for her.   Depression screen [facility] 2/9 05/09/2021 03/05/2021 01/28/2021  Decreased Interest 0 0 1  Down, Depressed, Hopeless 1 0 1  PHQ - 2 Score 1 0 2  Altered sleeping 1 - 2  Tired, decreased energy 1 - 2  Change in appetite 1 - 2  Feeling bad or failure about yourself  1 - 1  Trouble concentrating 3 - 2  Moving slowly or fidgety/restless 0 - 3  Suicidal thoughts 0 - 0  PHQ-9 Score 8 - 14  Difficult doing work/chores Very difficult - Somewhat difficult  Some recent data might be hidden   GAD 7 : Generalized Anxiety Score 05/09/2021 01/28/2021 06/12/2020 05/22/2020  Nervous, Anxious, on Edge '3 2 2 2  ' Control/stop worrying '3 3 2 2  ' Worry too much - different things '2 3 2 2  ' Trouble relaxing '3 2 2 1  ' Restless '1 1 2 1  ' Easily annoyed or irritable '1 1 2 3  ' Afraid - awful might happen 0 '1 2 2  ' Total GAD 7 Score '13 13 14 13  ' Anxiety Difficulty Somewhat difficult Somewhat difficult Somewhat difficult Very difficult     Past Medical History:  Diagnosis Date   Anxiety    Depression     History reviewed. No pertinent surgical history.  Family History  Problem Relation Age of Onset   Hypertension Father    Hypothyroidism Father    Vitamin D deficiency Father     Bipolar disorder Maternal Uncle    Bipolar disorder Maternal Grandfather    Depression Paternal Grandmother     Social History   Socioeconomic History   Marital status: Single    Spouse name: Not on file   Number of children: Not on file   Years of education: Not on file   Highest education level: Not on file  Occupational History   Not on file  Tobacco Use   Smoking status: Never   Smokeless tobacco: Never  Vaping Use   Vaping Use: Never used  Substance and Sexual Activity   Alcohol use: Yes    Comment: occ   Drug use: Never   Sexual activity: Not Currently    Birth control/protection: Pill, Injection  Other Topics Concern   Not on file  Social History Narrative   Lives with mom dad and brother   Left Handed   Drinks 2-3 cups caffeine daily   Social Determinants of Health   Financial Resource Strain: Not on file  Food Insecurity: Not on file  Transportation Needs: Not on file  Physical Activity: Not on file  Stress: Not on file  Social Connections: Not on file  Intimate Partner Violence: Not on file  Outpatient Medications Prior to Visit  Medication Sig Dispense Refill   Cyanocobalamin (VITAMIN B-12 PO) Take by mouth.     medroxyPROGESTERone (DEPO-PROVERA) 150 MG/ML injection Inject 1 mL (150 mg total) into the muscle every 3 (three) months. 3 mL 1   omeprazole (PRILOSEC) 20 MG capsule TAKE 1 CAPSULE BY MOUTH ONCE DAILY FOR ACID REFLUX 90 capsule 3   sertraline (ZOLOFT) 50 MG tablet Take 1 tablet (50 mg total) by mouth daily. 90 tablet 1   VITAMIN D, CHOLECALCIFEROL, PO Take by mouth.     predniSONE (DELTASONE) 10 MG tablet Take 36m on day 1. Reduce by 152meach subsequent day. (60, 50, 40, 30, 20, 10, stop) 21 tablet 0   No facility-administered medications prior to visit.    No Known Allergies  Review of Systems As per HPI.     Objective:    Physical Exam Vitals and nursing note reviewed.  Constitutional:      General: She is not in acute  distress.    Appearance: She is not ill-appearing, toxic-appearing or diaphoretic.  HENT:     Head: Normocephalic and atraumatic.  Pulmonary:     Effort: Pulmonary effort is normal. No respiratory distress.  Musculoskeletal:     Right lower leg: No edema.     Left lower leg: No edema.  Skin:    General: Skin is warm and dry.  Neurological:     General: No focal deficit present.     Mental Status: She is alert and oriented to person, place, and time.  Psychiatric:        Attention and Perception: Attention normal.        Mood and Affect: Mood is anxious.        Speech: Speech normal.        Behavior: Behavior normal.        Cognition and Memory: Cognition normal.    BP 131/89   Pulse (!) 119   Temp 98 F (36.7 C) (Oral)   Ht '5\' 6"'  (1.676 m)   Wt 296 lb 2 oz (134.3 kg)   BMI 47.80 kg/m  Wt Readings from Last 3 Encounters:  05/09/21 296 lb 2 oz (134.3 kg)  04/09/21 296 lb (134.3 kg)  03/07/21 293 lb 12.8 oz (133.3 kg)    Health Maintenance Due  Topic Date Due   COVID-19 Vaccine (1) Never done   Hepatitis C Screening  Never done    There are no preventive care reminders to display for this patient.   Lab Results  Component Value Date   TSH 1.330 03/07/2021   Lab Results  Component Value Date   WBC 12.2 (H) 04/18/2021   HGB 13.9 04/18/2021   HCT 40.7 04/18/2021   MCV 98 (H) 04/18/2021   PLT 413 04/18/2021   Lab Results  Component Value Date   NA 141 03/07/2021   K 4.0 03/07/2021   CO2 24 03/07/2021   GLUCOSE 110 (H) 03/07/2021   BUN 3 (L) 03/07/2021   CREATININE 0.56 (L) 03/07/2021   BILITOT 0.7 03/07/2021   ALKPHOS 158 (H) 03/07/2021   AST 38 03/07/2021   ALT 48 (H) 03/07/2021   PROT 6.7 03/07/2021   ALBUMIN 4.7 03/11/2021   CALCIUM 9.3 03/07/2021   ANIONGAP 12 02/28/2021   EGFR 134 03/07/2021   No results found for: CHOL No results found for: HDL No results found for: LDLCALC No results found for: TRIG No results found for: [facility]  Results  Component  Value Date   HGBA1C 5.0 11/04/2020       Assessment & Plan:   Debra Copeland was seen today for anxiety.  Diagnoses and all orders for this visit:  Generalized anxiety disorder GAD score of 13 today. Prefers change from zoloft. Start celexa as below. Hydroxyzine prn.  -     citalopram (CELEXA) 20 MG tablet; Take 1 tablet (20 mg total) by mouth daily. -     hydrOXYzine (ATARAX/VISTARIL) 10 MG tablet; Take 1 tablet (10 mg total) by mouth 3 (three) times daily as needed.  Depressive disorder PHQ score of 8 today. Celexa as below.  -     citalopram (CELEXA) 20 MG tablet; Take 1 tablet (20 mg total) by mouth daily.  Return in about 6 weeks (around 06/20/2021) for with PCP for anxiety.  The patient indicates understanding of these issues and agrees with the plan.   Gwenlyn Perking, FNP

## 2021-05-19 NOTE — Telephone Encounter (Signed)
I have informed [name], RN in the infusion suite that the tysabri infusion should be cancelled.  I have given [name] in research patient's information for the research study on zeposia.

## 2021-05-19 NOTE — Telephone Encounter (Signed)
Pt has decided to consider the zeposia research trial. Information has been given to the research department.

## 2021-05-21 ENCOUNTER — Other Ambulatory Visit: Payer: Self-pay | Admitting: Neurology

## 2021-05-21 ENCOUNTER — Telehealth: Payer: Self-pay | Admitting: Neurology

## 2021-05-21 DIAGNOSIS — G35 Multiple sclerosis: Secondary | ICD-10-CM

## 2021-05-21 NOTE — Telephone Encounter (Signed)
I reviewed the notes and data for [employer].  She is a 20 year old woman who was diagnosed with MS done may 2022.  Neurologic symptoms: Optic neuritis 01/17/2021 Numbness in hands   Data: MRI of the orbits 02/28/2021 shows subtle T2 hyperintense signal near the chiasm on the right and possibly involving the chiasm.    MRI of the brain 02/28/2021 was read as normal.  However, there are 2 T2 hyperintense foci within the brainstem, 1 to the left at the medullary pontine junction and another to the right in the medulla.  MRI of the cervical and thoracic spine 03/26/2021 shows foci anteromedially at C4-C5, to the right at C5 to the left to C6-C7.  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  Treatment: She initially had plan to start Tysabri (did not start) but opted to go on oral medication instead prompting referral for the research study.  She has not been on any disease modifying therapy.

## 2021-06-04 ENCOUNTER — Telehealth: Payer: Self-pay | Admitting: Neurology

## 2021-06-04 NOTE — Telephone Encounter (Signed)
I spoke with Savanna's mother about the varicella antibody returning negative.  She was able to get her vaccination record.  She had her first varicella vaccination in 2003, at about age 20.  She had a booster in 2016 at about age 44.  According to the protocol,"Subjects must have documentation of positive varicella zoster virus (VZV) immunoglobulin G (IgG) antibody status or complete VZV vaccination at least 30 days prior to enrollment"  Therefore, I believe that she would be a candidate for the study.  Her tuberculosis test was not run due to suspected increased lipids.  Of note, she did not eat right before the blood work.  She has never had a lipid panel, to the mother's knowledge.  We will most likely need to recheck the TB test and we will make sure she is fasting at the time.  It would be reasonable to check a lipid panel given the first response.   We also discussed a Plan B if she does not qualify for the study.  She would greatly prefer to be on oral medication.  We will continue the Zeposia outside of the study or Vumerity/Tecfidera.  I may have her sign a service request form just in case to save time.

## 2021-06-23 ENCOUNTER — Telehealth: Payer: Self-pay | Admitting: Neurology

## 2021-06-23 NOTE — Telephone Encounter (Signed)
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  The lab was redrawn 2 weeks ago and we just heard back from [facility] they were unable to run the QuantiFERON TB test -  I am not sure why.  Redoing the test the third time would put her out of the screening window, setting her back a few more weeks  I discussed options with her mother.  I am concerned that she is going so long without getting started on medication and it would be best for her, at this time, to get medication outside of a drug study..  Therefore, she would like to start Zeposia the regular commercial way.  Could you  please MyChart her the start form for Zeposia and she will sign it and send it back.  She has had the baseline EKG and necessary blood work (the TB test was part of the drug study but not necessary to get started on commercial Zeposia)  She has an appointment to see Dr. Marjory Lies in September.

## 2021-06-24 NOTE — Telephone Encounter (Signed)
I have sent the zeposia start form to patient via mychart. Patient may need an ME screening, unsure if that was completed via research.

## 2021-06-24 NOTE — Telephone Encounter (Signed)
Dr. Marjory Lies signed the zeposia start form. Faxed start form to [facility] [employer]. Received a receipt of confirmation.  Initiated zeposia PA via CMM. Waiting for clinical questions. Key: [id number].

## 2021-06-24 NOTE — Telephone Encounter (Signed)
Submitted PA for zeposia to [facility]. Key: [id number]. Should have a determination within24-72 hours.

## 2021-06-30 NOTE — Telephone Encounter (Signed)
Completed PA for zeposia and returned via fax to [facility].

## 2021-06-30 NOTE — Telephone Encounter (Signed)
I called [facility]. They do not see this PA on file. They will fax me the PA to complete again.

## 2021-07-01 ENCOUNTER — Other Ambulatory Visit: Payer: Self-pay

## 2021-07-01 ENCOUNTER — Encounter: Payer: Self-pay | Admitting: Family Medicine

## 2021-07-01 ENCOUNTER — Ambulatory Visit: Payer: BC Managed Care – PPO | Admitting: Family Medicine

## 2021-07-01 VITALS — BP 127/78 | HR 130 | Temp 97.8°F | Ht 66.0 in | Wt 295.2 lb

## 2021-07-01 DIAGNOSIS — Z3042 Encounter for surveillance of injectable contraceptive: Secondary | ICD-10-CM

## 2021-07-01 DIAGNOSIS — F329 Major depressive disorder, single episode, unspecified: Secondary | ICD-10-CM | POA: Diagnosis not present

## 2021-07-01 DIAGNOSIS — G35 Multiple sclerosis: Secondary | ICD-10-CM | POA: Diagnosis not present

## 2021-07-01 DIAGNOSIS — F411 Generalized anxiety disorder: Secondary | ICD-10-CM

## 2021-07-01 DIAGNOSIS — F32A Depression, unspecified: Secondary | ICD-10-CM

## 2021-07-01 DIAGNOSIS — F418 Other specified anxiety disorders: Secondary | ICD-10-CM

## 2021-07-01 MED ORDER — HYDROXYZINE HCL 25 MG PO TABS: 25.0000 mg | ORAL_TABLET | Freq: Three times a day (TID) | ORAL | 1 refills | Status: DC | PRN

## 2021-07-01 MED ORDER — MEDROXYPROGESTERONE ACETATE 150 MG/ML IM SUSP: 150.0000 mg | INTRAMUSCULAR | 1 refills | Status: DC

## 2021-07-01 MED ORDER — CITALOPRAM HYDROBROMIDE 40 MG PO TABS: 40.0000 mg | ORAL_TABLET | Freq: Every day | ORAL | 3 refills | Status: DC

## 2021-07-01 NOTE — Progress Notes (Signed)
Subjective: CC: Anxiety depression PCP: Raliegh Ip, DO ZHG:[id number] Debra Copeland is a 20 y.o. female presenting to clinic today for:  1.  Anxiety depression Patient was seen in July and started on Celexa 20 mg daily.  The Zoloft, Prozac, BuSpar and Wellbutrin have been totally ineffective in the past and she wanted to change.  She was also given hydroxyzine for as needed use.  She has felt that this regimen seems to be working better than previous.  She admits that her anxiety has been exacerbated by recent diagnosis of multiple sclerosis.  She is to be starting a new daily medication orally but she is waiting the pharmacy to get this in stock.  She is closely followed by neurology.  She notes that the vision disturbances that were present previously have gotten somewhat better but she admits to frequent falls and imbalance.  She does want to see a counselor but worries about her parents.  They do not believe in counseling services and this has been her barrier to seeking counseling.   ROS: Per HPI  No Known Allergies Past Medical History:  Diagnosis Date   Anxiety    Depression     Current Outpatient Medications:    citalopram (CELEXA) 20 MG tablet, Take 1 tablet (20 mg total) by mouth daily., Disp: 30 tablet, Rfl: 5   Cyanocobalamin (VITAMIN Debra-12 PO), Take by mouth., Disp: , Rfl:    hydrOXYzine (ATARAX/VISTARIL) 10 MG tablet, Take 1 tablet (10 mg total) by mouth 3 (three) times daily as needed., Disp: 30 tablet, Rfl: 0   medroxyPROGESTERone (DEPO-PROVERA) 150 MG/ML injection, Inject 1 mL (150 mg total) into the muscle every 3 (three) months., Disp: 3 mL, Rfl: 1   omeprazole (PRILOSEC) 20 MG capsule, TAKE 1 CAPSULE BY MOUTH ONCE DAILY FOR ACID REFLUX, Disp: 90 capsule, Rfl: 3   VITAMIN D, CHOLECALCIFEROL, PO, Take by mouth., Disp: , Rfl:  Social History   Socioeconomic History   Marital status: Single    Spouse name: Not on file   Number of children: Not on file   Years of  education: Not on file   Highest education level: Not on file  Occupational History   Not on file  Tobacco Use   Smoking status: Never   Smokeless tobacco: Never  Vaping Use   Vaping Use: Never used  Substance and Sexual Activity   Alcohol use: Yes    Comment: occ   Drug use: Never   Sexual activity: Not Currently    Birth control/protection: Pill, Injection  Other Topics Concern   Not on file  Social History Narrative   Lives with mom dad and brother   Left Handed   Drinks 2-3 cups caffeine daily   Social Determinants of Health   Financial Resource Strain: Not on file  Food Insecurity: Not on file  Transportation Needs: Not on file  Physical Activity: Not on file  Stress: Not on file  Social Connections: Not on file  Intimate Partner Violence: Not on file   Family History  Problem Relation Age of Onset   Hypertension Father    Hypothyroidism Father    Vitamin D deficiency Father    Bipolar disorder Maternal Uncle    Bipolar disorder Maternal Grandfather    Depression Paternal Grandmother     Objective: Office vital signs reviewed. BP 127/78   Pulse (!) 130   Temp 97.8 F (36.6 C)   Ht 5\' 6"  (1.676 m)   Wt 295 lb  3.2 oz (133.9 kg)   SpO2 90%   BMI 47.65 kg/m   Physical Examination:  General: Awake, alert, morbidly obese, No acute distress HEENT: Normal, sclera white Cardio: regular increased Pulm:  normal work of breathing on room air MSK: ambulating independently. Psych: Speech normal.  Thought process linear.  She has somewhat pressured speech and appears somewhat anxious  Depression screen [facility] 2/9 07/01/2021 05/09/2021 03/05/2021  Decreased Interest 1 0 0  Down, Depressed, Hopeless 0 1 0  PHQ - 2 Score 1 1 0  Altered sleeping 1 1 -  Tired, decreased energy 1 1 -  Change in appetite 1 1 -  Feeling bad or failure about yourself  0 1 -  Trouble concentrating 3 3 -  Moving slowly or fidgety/restless 0 0 -  Suicidal thoughts 0 0 -  PHQ-9 Score 7 8 -   Difficult doing work/chores Somewhat difficult Very difficult -  Some recent data might be hidden   GAD 7 : Generalized Anxiety Score 07/01/2021 05/09/2021 01/28/2021 06/12/2020  Nervous, Anxious, on Edge 2 3 2 2   Control/stop worrying 2 3 3 2   Worry too much - different things 2 2 3 2   Trouble relaxing 3 3 2 2   Restless 1 1 1 2   Easily annoyed or irritable 0 1 1 2   Afraid - awful might happen 1 0 1 2  Total GAD 7 Score 11 13 13 14   Anxiety Difficulty Very difficult Somewhat difficult Somewhat difficult Somewhat difficult     Assessment/ Plan: 20 y.o. female   Generalized anxiety disorder - Plan: citalopram (CELEXA) 40 MG tablet, hydrOXYzine (ATARAX/VISTARIL) 25 MG tablet  Anxiety about health - Plan: citalopram (CELEXA) 40 MG tablet, hydrOXYzine (ATARAX/VISTARIL) 25 MG tablet  Depressive disorder - Plan: citalopram (CELEXA) 40 MG tablet  Multiple sclerosis (HCC)  Anxiety disorder and depressive disorder improving but not at goal.  Increase Celexa to 40 mg daily, Atarax advanced.  I again highly recommended that she seek counseling services and I have reached out to virtual behavioral health to assist with this.  Barriers to care include her parents, who apparently do not believe in counseling.  Much of her anxiety and depression are exacerbated by recent diagnosis of multiple sclerosis, for which she has yet to start treatment.  No orders of the defined types were placed in this encounter.  No orders of the defined types were placed in this encounter.    , DO [facility] [phone]

## 2021-07-01 NOTE — Telephone Encounter (Signed)
I have informed [facility] of approval.

## 2021-07-01 NOTE — Telephone Encounter (Signed)
Received fax from [facility]: Zeposia approved 06/30/21 to 06/30/22. Also received fax re: patient may be eligible for bridge supply Rx at no cost.

## 2021-07-07 ENCOUNTER — Telehealth: Payer: Self-pay | Admitting: Diagnostic Neuroimaging

## 2021-07-07 NOTE — Telephone Encounter (Signed)
I called [facility] and spoke with pharmacist Renee. She states a drug to drug interaction warning populated with the Zeposia and Celexa . Renee states this combination can cause prolonged OT intervals along with bradycardica. She sts a flag came across stating "combination is not reccommended"  Verbal confirmation from MD is needed in order to proceed with filling/shipping Zeposia Will fwd to Dr. Marjory Lies for review.

## 2021-07-07 NOTE — Telephone Encounter (Signed)
[facility]) called, drug interaction between [place] and Celexa. Would like a call from the nurse to discuss.

## 2021-07-07 NOTE — Telephone Encounter (Signed)
Recommend to change citalopram to sertraline 50mg  daily. Then can start zeposia.   , MD 07/07/2021, 5:23 PM Certified in Neurology, Neurophysiology and Neuroimaging  [facility] [address] [place], [place] [phone]

## 2021-07-08 ENCOUNTER — Encounter: Payer: Self-pay | Admitting: Family Medicine

## 2021-07-08 NOTE — Telephone Encounter (Signed)
Raynelle Fanning, pharmD with [facility], called and I was able to take this phone call.  Raynelle Fanning wanted to know the status of the Zeposia.  I advised her that we are discussing a change from citalopram to sertraline with patient's primary care.  Raynelle Fanning reports there is still an interaction between sertraline and Zeposia.  They can still fill the Zeposia if the provider is agreeable to this.  However they will continue to hold the Zeposia until they hear further from [place].

## 2021-07-08 NOTE — Telephone Encounter (Addendum)
I called patient.  I discussed this with patient's mother, Martie Lee, per DPR.  They will discuss a change from citalopram to sertraline 50 mg daily with patient's primary care physician.  If patient's PCP is also agreeable to this and will change the prescription to sertraline, I can call the specialty pharmacy back and let them know to ship there is Zeposia.  Patient's mother will let us know when this has been completed.

## 2021-07-08 NOTE — Telephone Encounter (Signed)
I spoke with Dr. Epimenio Foot again regarding this. There will be a theoretical drug interaction with SSRIs. Dr. Epimenio Foot feels that sertraline is ok with zeposia in this case, even with the possible interaction:  "Monitor patients closely for the development of hypertension, including hypertensive crises, if combined with serotonergic agents. Consider avoiding this combination if possible."  Buspar is an option with no drug interactions with zeposia, per Dr. Epimenio Foot.  I will send to Dr. Nadine Counts and Dr. Marjory Lies for further consideration.

## 2021-07-09 NOTE — Telephone Encounter (Signed)
So the problem is she has had severe depression/ anxiety with both Buspar AND Zoloft in the past.  I'm glad to try and get  her in for genesite testing for alternatives but neither Buspar nor Zoloft sound like good alternatives for this patient given her past experiences.  I'm assuming SSRI's in general react with the new med?  What about SNRIs or Tricyclic antidepressants?

## 2021-07-10 ENCOUNTER — Ambulatory Visit (INDEPENDENT_AMBULATORY_CARE_PROVIDER_SITE_OTHER): Payer: BC Managed Care – PPO

## 2021-07-10 ENCOUNTER — Other Ambulatory Visit: Payer: BC Managed Care – PPO

## 2021-07-10 ENCOUNTER — Other Ambulatory Visit: Payer: Self-pay

## 2021-07-10 DIAGNOSIS — Z3042 Encounter for surveillance of injectable contraceptive: Secondary | ICD-10-CM | POA: Diagnosis not present

## 2021-07-10 DIAGNOSIS — Z13220 Encounter for screening for lipoid disorders: Secondary | ICD-10-CM

## 2021-07-10 MED ORDER — MEDROXYPROGESTERONE ACETATE 150 MG/ML IM SUSP
150.0000 mg | INTRAMUSCULAR | Status: AC
  Administered 2021-07-10 – 2022-06-04 (×3): 150 mg via INTRAMUSCULAR

## 2021-07-10 NOTE — Progress Notes (Signed)
Medroxyprogesterone injection given to left upper outer quadrant.  Patient tolerated well. 

## 2021-07-11 LAB — LIPID PANEL
Chol/HDL Ratio: 5.4 ratio — ABNORMAL HIGH (ref 0.0–4.4)
Cholesterol, Total: 168 mg/dL (ref 100–199)
HDL: 31 mg/dL — ABNORMAL LOW (ref >39)
LDL Chol Calc (NIH): 95 mg/dL (ref 0–99)
Triglycerides: 250 mg/dL — ABNORMAL HIGH (ref 0–149)
VLDL Cholesterol Cal: 42 mg/dL — ABNORMAL HIGH (ref 5–40)

## 2021-07-15 ENCOUNTER — Telehealth: Payer: Self-pay

## 2021-07-15 NOTE — Telephone Encounter (Signed)
Changing DMT to Vumerity. [facility] has been informed to d/c zeposia.

## 2021-07-15 NOTE — Telephone Encounter (Signed)
Dr. Marjory Lies would like to start patient on Vumerity instead of Zeposia due to the interaction with citalopram.  There is no known interaction with Vumerity and citalopram.  I called patient, spoke with patient's mother, per DPR.  They are agreeable to starting patient on Vumerity.  I will send them to start form via MyChart and they will get it back to me ASAP.  PA for Vumerity completed on CMM.  It was approved by [facility] from July 11, 2021 to July 11, 2022.  PA #: [id number]. Key: [id number].

## 2021-07-15 NOTE — Telephone Encounter (Signed)
Called [facility] and informed them to discontinue to zepoisa. Pt is changing to Vumer[facility].

## 2021-07-15 NOTE — Telephone Encounter (Signed)
Received Vumerity Start Form signed by patient. Faxed to [employer] along with PA approval from [facility]. Received a receipt of confirmation.

## 2021-07-16 ENCOUNTER — Ambulatory Visit: Payer: BC Managed Care – PPO | Admitting: Diagnostic Neuroimaging

## 2021-07-17 NOTE — Telephone Encounter (Signed)
Debra Copeland from [employer] is asking to be called back at [phone] Debra Copeland [phone] this is re: when pt is to start the Zeposia, she has been informed there is a hold on the medication .  Debra Copeland  states that [facility] pharmacy also needs to be called at [phone]

## 2021-07-17 NOTE — Telephone Encounter (Signed)
Received e mail from [employer], [employer]:  [facility] needs clarification on the prescription - they advised they are missing the provider's signature, date & are unable to read the instructions.  If needed, they can be reached by phone at [phone] or fax [phone]. I called # provided, was transferred to her pharmacy [facility] @  [phone], spoke with Nala, pharmacist who stated they can't read Rx; it is too dark. Per vumerity start form Dr Marjory Lies signed, I gave her verbal orders for titration month 1 and maintenance Rx  month 2.  She verbalized understanding, appreciation.

## 2021-07-22 NOTE — Telephone Encounter (Signed)
I called Debra Copeland at [facility].  I left a message advising her that patient will not be starting Zeposia at this time.

## 2021-08-04 NOTE — Telephone Encounter (Addendum)
I called patient's mother, Debra Copeland, per DPR.  Patient's mother reports that the patient started Vumerity on September 14.  She is tolerating it well.   Occasionally,she has mild flushing.  She is on the Vumerity maintenance dose of 2 capsules twice daily.  She will keep her follow-up as scheduled in December with Dr. Marjory Lies.  Patient will let us know if she has further questions or concerns.

## 2021-08-12 ENCOUNTER — Encounter: Payer: Self-pay | Admitting: Family Medicine

## 2021-08-12 ENCOUNTER — Ambulatory Visit: Payer: BC Managed Care – PPO | Admitting: Family Medicine

## 2021-08-12 ENCOUNTER — Other Ambulatory Visit: Payer: Self-pay

## 2021-08-12 VITALS — BP 135/94 | HR 106 | Temp 97.9°F | Ht 66.0 in | Wt 290.4 lb

## 2021-08-12 DIAGNOSIS — F32A Depression, unspecified: Secondary | ICD-10-CM | POA: Diagnosis not present

## 2021-08-12 DIAGNOSIS — F411 Generalized anxiety disorder: Secondary | ICD-10-CM | POA: Diagnosis not present

## 2021-08-12 DIAGNOSIS — F418 Other specified anxiety disorders: Secondary | ICD-10-CM | POA: Diagnosis not present

## 2021-08-12 DIAGNOSIS — R030 Elevated blood-pressure reading, without diagnosis of hypertension: Secondary | ICD-10-CM

## 2021-08-12 DIAGNOSIS — G35 Multiple sclerosis: Secondary | ICD-10-CM

## 2021-08-12 MED ORDER — HYDROXYZINE HCL 25 MG PO TABS: 25.0000 mg | ORAL_TABLET | Freq: Three times a day (TID) | ORAL | 3 refills | Status: DC | PRN

## 2021-08-12 NOTE — Patient Instructions (Signed)
Your provider wants you to schedule an appointment with a Psychologist/Psychiatrist. The following list of offices requires the patient to call and make their own appointment, as there is information they need that only you can provide. Please feel free to choose form the following providers:  [facility]   [phone] Crisis Recovery in [place] [phone]  [facility]  [phone]   [address] [place], [place]  (Scheduled through [facility]) Must call and do an interview for appointment. Sees Children / Accepts Medicaid  [facility]    [phone]  [address], [address]    [place], [place]       [facility]  [phone] [address] [place], [place]  Evaluates for Autism but does not treat it Sees Children / Accepts Medicaid  [facility]    [phone] [address], [address]   [place], [place] Medication management, substance abuse, bipolar, grief, family, marriage, OCD, anxiety, PTSD Sees children / Accepts Medicaid  [facility]    [phone] [address] [place], [place] Sees children / Accepts Medicaid  [facility]  [phone] [address] [place], [place]   Dr Akinlayo     [phone] [address] [place], [place]  Sees ADD & ADHD for treatment Accepts Medicaid  [facility]  [phone] [address] [place], [place] Evaluates for Autism Accepts Medicaid  [facility]  [phone] [address] [place], [place]  Does Adult ADD evaluations Does not accept Medicaid  [facility]   [phone] [address]   [place], [place] Uses animal therapy  Sees children as young as 3 years old Accepts Medicaid  [facility]     [phone]    [address]  [place], [place] [address] Sees children Accepts Medicaid  

## 2021-08-12 NOTE — Progress Notes (Signed)
Subjective: CC: Follow-up anxiety depression PCP: Raliegh Ip, DO YDX:AJOINOM B Debra Copeland is a 20 y.o. female presenting to clinic today for:  1.  Anxiety disorder, depression/ MS She reports compliance with Celexa 40 mg.  She has felt that her anxiety overall has been better.  She continues to have some breakthrough panic but symptoms seem to be somewhat alleviated by Atarax.  She has been utilizing Atarax as of today with her medication for multiple sclerosis as she had some associated itching after taking the medication.  She notes that she consider counseling services but it caused more anxiety because her parents still do not approve of therapy.  She had somebody call her but it was an unknown number so she never picked it up.  She does not wish to pursue counseling at this time because of the barriers by her family.  Denies any excessive sedation with current dose of Atarax.  Sleep has been intermittently interrupted and sometimes she finds it difficult to get back to sleep.  She identifies some isolating behaviors and anhedonia where she is not enjoying things that she normally enjoys as much.   ROS: Per HPI  No Known Allergies Past Medical History:  Diagnosis Date   Anxiety    Depression     Current Outpatient Medications:    citalopram (CELEXA) 40 MG tablet, Take 1 tablet (40 mg total) by mouth daily., Disp: 90 tablet, Rfl: 3   Cyanocobalamin (VITAMIN B-12 PO), Take by mouth., Disp: , Rfl:    Diroximel Fumarate (VUMERITY) 231 MG CPDR, Take 462 mg by mouth 2 (two) times daily., Disp: , Rfl:    hydrOXYzine (ATARAX/VISTARIL) 25 MG tablet, Take 1 tablet (25 mg total) by mouth 3 (three) times daily as needed., Disp: 90 tablet, Rfl: 1   medroxyPROGESTERone (DEPO-PROVERA) 150 MG/ML injection, Inject 1 mL (150 mg total) into the muscle every 3 (three) months., Disp: 3 mL, Rfl: 1   omeprazole (PRILOSEC) 20 MG capsule, TAKE 1 CAPSULE BY MOUTH ONCE DAILY FOR ACID REFLUX, Disp: 90  capsule, Rfl: 3   VITAMIN D, CHOLECALCIFEROL, PO, Take by mouth., Disp: , Rfl:   Current Facility-Administered Medications:    medroxyPROGESTERone (DEPO-PROVERA) injection 150 mg, 150 mg, Intramuscular, Q90 days, Eleena Grater M, DO, 150 mg at 07/10/21 1557 Social History   Socioeconomic History   Marital status: Single    Spouse name: Not on file   Number of children: Not on file   Years of education: Not on file   Highest education level: Not on file  Occupational History   Not on file  Tobacco Use   Smoking status: Never   Smokeless tobacco: Never  Vaping Use   Vaping Use: Never used  Substance and Sexual Activity   Alcohol use: Yes    Comment: occ   Drug use: Never   Sexual activity: Not Currently    Birth control/protection: Pill, Injection  Other Topics Concern   Not on file  Social History Narrative   Lives with mom dad and brother   Left Handed   Drinks 2-3 cups caffeine daily   Social Determinants of Health   Financial Resource Strain: Not on file  Food Insecurity: Not on file  Transportation Needs: Not on file  Physical Activity: Not on file  Stress: Not on file  Social Connections: Not on file  Intimate Partner Violence: Not on file   Family History  Problem Relation Age of Onset   Hypertension Father    Hypothyroidism  Father    Vitamin D deficiency Father    Bipolar disorder Maternal Uncle    Bipolar disorder Maternal Grandfather    Depression Paternal Grandmother     Objective: Office vital signs reviewed. BP (!) 135/94   Pulse (!) 106   Temp 97.9 F (36.6 C)   Ht 5\' 6"  (1.676 m)   Wt 290 lb 6.4 oz (131.7 kg)   SpO2 95%   BMI 46.87 kg/m   Physical Examination:  General: Awake, alert, No acute distress Cardio: r slightly tachycardic with regular rhythm, S1S2 heard, no murmurs appreciated Pulm: clear to auscultation bilaterally, no wheezes, rhonchi or rales; normal work of breathing on room air Skin: Multiple picked skin lesions  noted along the lower extremities bilaterally Psych: Appears somewhat anxious but less so than previous checkup.  Depression screen [facility] 2/9 08/12/2021 07/01/2021 05/09/2021  Decreased Interest 1 1 0  Down, Depressed, Hopeless 0 0 1  PHQ - 2 Score 1 1 1   Altered sleeping 1 1 1   Tired, decreased energy 3 1 1   Change in appetite 1 1 1   Feeling bad or failure about yourself  0 0 1  Trouble concentrating 0 3 3  Moving slowly or fidgety/restless 0 0 0  Suicidal thoughts 0 0 0  PHQ-9 Score 6 7 8   Difficult doing work/chores Not difficult at all Somewhat difficult Very difficult  Some recent data might be hidden   GAD 7 : Generalized Anxiety Score 08/12/2021 07/01/2021 05/09/2021 01/28/2021  Nervous, Anxious, on Edge 1 2 3 2   Control/stop worrying 0 2 3 3   Worry too much - different things 0 2 2 3   Trouble relaxing 2 3 3 2   Restless 2 1 1 1   Easily annoyed or irritable 0 0 1 1  Afraid - awful might happen 0 1 0 1  Total GAD 7 Score 5 11 13 13   Anxiety Difficulty Somewhat difficult Very difficult Somewhat difficult Somewhat difficult   Assessment/ Plan: 20 y.o. female   Generalized anxiety disorder - Plan: hydrOXYzine (ATARAX/VISTARIL) 25 MG tablet  Anxiety about health - Plan: hydrOXYzine (ATARAX/VISTARIL) 25 MG tablet  Depressive disorder  Multiple sclerosis (HCC)  Elevated blood pressure reading without diagnosis of hypertension  Anxiety and depressive scores overall are better.  I still think that she would highly benefit from counseling services but again she becomes more anxious thinking about trying to get them without her parents approval.  For now we will continue Celexa.  I will advance her hydroxyzine to 25 to 50 mg 3 times daily as needed.  Would like to reassess her in the next 3 to 6 months, sooner if concerns arise  MS seems to be responding to current medication.  She has some itching associated with it so will CC her specialist just as an FYI as I am not sure that this is  typical.  Continue Atarax with each dose for now  Would like to reassess her blood pressure in 3 months.  If persistently elevated need to consider initiation of antihypertensive  No orders of the defined types were placed in this encounter.  No orders of the defined types were placed in this encounter.    , DO [facility] [phone]

## 2021-09-04 ENCOUNTER — Ambulatory Visit: Payer: BC Managed Care – PPO | Admitting: Family Medicine

## 2021-09-04 ENCOUNTER — Encounter: Payer: Self-pay | Admitting: Family Medicine

## 2021-09-04 DIAGNOSIS — J069 Acute upper respiratory infection, unspecified: Secondary | ICD-10-CM

## 2021-09-04 MED ORDER — PREDNISONE 20 MG PO TABS: 40.0000 mg | ORAL_TABLET | Freq: Every day | ORAL | 0 refills | Status: AC

## 2021-09-04 NOTE — Progress Notes (Signed)
   Virtual Visit  Note Due to COVID-19 pandemic this visit was conducted virtually. This visit type was conducted due to national recommendations for restrictions regarding the COVID-19 Pandemic (e.g. social distancing, sheltering in place) in an effort to limit this patient's exposure and mitigate transmission in our community. All issues noted in this document were discussed and addressed.  A physical exam was not performed with this format.  I connected with Debra Copeland on 09/04/21 at 1305 by telephone and verified that I am speaking with the correct person using two identifiers. Debra Copeland is currently located at home and no one is currently with her during the visit. The provider, Gabriel Earing, FNP is located in their office at time of visit.  I discussed the limitations, risks, security and privacy concerns of performing an evaluation and management service by telephone and the availability of in person appointments. I also discussed with the patient that there may be a patient responsible charge related to this service. The patient expressed understanding and agreed to proceed.  CC: fever  History and Present Illness:  HPI [name] reports a fever yesterday. Her temperature ranged from 98-101. She is afebrile today. She reports throat irritation and congestion since yesterday. She has had a cough for about 1 month. The cough worsened yesterday. She denies shortness of breath, chest pain, vomiting, or diarrhea. She has been taking advil cold and sinus for her symptoms.      ROS As per HPI.   Observations/Objective: Alert and oriented x 3. Able to speak in full sentences without difficulty.   Assessment and Plan: [name] was seen today for fever.  Diagnoses and all orders for this visit:  Upper respiratory tract infection, unspecified type Prednisone burst as below. Recommened flu and Covid testing based on symptoms. Patient is unsure of if she will be able to  come for testing today. She will call the office if she is. Discussed symptomatic care. Return to office for new or worsening symptoms, or if symptoms persist.  -     predniSONE (DELTASONE) 20 MG tablet; Take 2 tablets (40 mg total) by mouth daily with breakfast for 3 days.    Follow Up Instructions: As needed.     I discussed the assessment and treatment plan with the patient. The patient was provided an opportunity to ask questions and all were answered. The patient agreed with the plan and demonstrated an understanding of the instructions.   The patient was advised to call back or seek an in-person evaluation if the symptoms worsen or if the condition fails to improve as anticipated.  The above assessment and management plan was discussed with the patient. The patient verbalized understanding of and has agreed to the management plan. Patient is aware to call the clinic if symptoms persist or worsen. Patient is aware when to return to the clinic for a follow-up visit. Patient educated on when it is appropriate to go to the emergency department.   Time call ended:  1418  I provided 13 minutes of  non face-to-face time during this encounter.    Gabriel Earing, FNP

## 2021-09-26 ENCOUNTER — Ambulatory Visit (INDEPENDENT_AMBULATORY_CARE_PROVIDER_SITE_OTHER): Payer: BC Managed Care – PPO | Admitting: *Deleted

## 2021-09-26 ENCOUNTER — Other Ambulatory Visit: Payer: Self-pay

## 2021-09-26 DIAGNOSIS — Z3042 Encounter for surveillance of injectable contraceptive: Secondary | ICD-10-CM | POA: Diagnosis not present

## 2021-10-13 ENCOUNTER — Ambulatory Visit (INDEPENDENT_AMBULATORY_CARE_PROVIDER_SITE_OTHER): Payer: BC Managed Care – PPO | Admitting: Diagnostic Neuroimaging

## 2021-10-13 ENCOUNTER — Encounter: Payer: Self-pay | Admitting: Diagnostic Neuroimaging

## 2021-10-13 DIAGNOSIS — G35 Multiple sclerosis: Secondary | ICD-10-CM | POA: Diagnosis not present

## 2021-10-13 NOTE — Progress Notes (Signed)
[facility]  PATIENT: Debra Copeland DOB: 03/15/2001  REFERRING CLINICIAN: Raliegh Ip, DO HISTORY FROM: patient  REASON FOR VISIT: follow up    HISTORICAL  CHIEF COMPLAINT:  No chief complaint on file.   HISTORY OF PRESENT ILLNESS:   UPDATE (10/13/21, VRP): Since last visit, doing well, now on vumerity. Vision better. Some muscle spasms in neck and hands.   UPDATE (04/09/21, VRP): Since last visit, doing a little better. Sxs worse with heat or activity. Some mild vision changes and numbness in hands. No other alleviating or aggravating factors.     PRIOR HPI: 20 year old female here for evaluation of vision changes.  Around January 17, 2021 patient noticed abnormal vision mainly from her right eye.  She felt that her central vision was decreased in her right eye when she closed her left eye, but she would see "extra brightness" in this region.  No spots or sparkles.  No dark spots or gaps in visual field.  No pain or headache.  Symptoms continued and slightly affected the left eye.  Patient went to ophthalmology on 02/28/2021 and exam was notable for decreased visual field testing in right greater than left eye, mainly inferior fields.  Patient was diagnosed with possible optic neuritis and sent to the ER for evaluation.  MRI of the brain and orbits was obtained.  Very subtle T2 hyperintensity was noted within the bilateral optic chiasm and proximal optic nerves, right greater than left side.  No abnormal enhancement noted.  She was recommended to be admitted for further testing and lumbar puncture but patient wanted to go home and follow-up with outpatient neurology.  Patient has had some numbness and cramps in her hands.  No other prodromal accidents injuries or traumas.   REVIEW OF SYSTEMS: Full 14 system review of systems performed and negative with exception of: As per HPI.  ALLERGIES: No Known Allergies  HOME MEDICATIONS: Outpatient Medications  Prior to Visit  Medication Sig Dispense Refill   citalopram (CELEXA) 40 MG tablet Take 1 tablet (40 mg total) by mouth daily. 90 tablet 3   Cyanocobalamin (VITAMIN B-12 PO) Take by mouth.     Diroximel Fumarate (VUMERITY) 231 MG CPDR Take 462 mg by mouth 2 (two) times daily.     hydrOXYzine (ATARAX/VISTARIL) 25 MG tablet Take 1-2 tablets (25-50 mg total) by mouth 3 (three) times daily as needed. 90 tablet 3   medroxyPROGESTERone (DEPO-PROVERA) 150 MG/ML injection Inject 1 mL (150 mg total) into the muscle every 3 (three) months. 3 mL 1   omeprazole (PRILOSEC) 20 MG capsule TAKE 1 CAPSULE BY MOUTH ONCE DAILY FOR ACID REFLUX 90 capsule 3   VITAMIN D, CHOLECALCIFEROL, PO Take by mouth.     Facility-Administered Medications Prior to Visit  Medication Dose Route Frequency Provider Last Rate Last Admin   medroxyPROGESTERone (DEPO-PROVERA) injection 150 mg  150 mg Intramuscular Q90 days Gottschalk, Ashly M, DO   150 mg at 09/26/21 1603    PAST MEDICAL HISTORY: Past Medical History:  Diagnosis Date   Anxiety    Depression     PAST SURGICAL HISTORY: No past surgical history on file.  FAMILY HISTORY: Family History  Problem Relation Age of Onset   Hypertension Father    Hypothyroidism Father    Vitamin D deficiency Father    Bipolar disorder Maternal Uncle    Bipolar disorder Maternal Grandfather    Depression Paternal Grandmother     SOCIAL HISTORY: Social History   Socioeconomic History  Marital status: Single    Spouse name: Not on file   Number of children: Not on file   Years of education: Not on file   Highest education level: Not on file  Occupational History   Not on file  Tobacco Use   Smoking status: Never   Smokeless tobacco: Never  Vaping Use   Vaping Use: Never used  Substance and Sexual Activity   Alcohol use: Yes    Comment: occ   Drug use: Never   Sexual activity: Not Currently    Birth control/protection: Pill, Injection  Other Topics Concern   Not  on file  Social History Narrative   Lives with mom dad and brother   Left Handed   Drinks 2-3 cups caffeine daily   Social Determinants of Health   Financial Resource Strain: Not on file  Food Insecurity: Not on file  Transportation Needs: Not on file  Physical Activity: Not on file  Stress: Not on file  Social Connections: Not on file  Intimate Partner Violence: Not on file     PHYSICAL EXAM  GENERAL EXAM/CONSTITUTIONAL: Vitals:  There were no vitals filed for this visit.  There is no height or weight on file to calculate BMI. Wt Readings from Last 3 Encounters:  08/12/21 290 lb 6.4 oz (131.7 kg)  07/01/21 295 lb 3.2 oz (133.9 kg)  05/09/21 296 lb 2 oz (134.3 kg)   Patient is in no distress; well developed, nourished and groomed; neck is supple  CARDIOVASCULAR: Examination of carotid arteries is normal; no carotid bruits Regular rate and rhythm, no murmurs Examination of peripheral vascular system by observation and palpation is normal  EYES: Ophthalmoscopic exam of optic discs and posterior segments is normal; no papilledema or hemorrhages No results found.  MUSCULOSKELETAL: Gait, strength, tone, movements noted in Neurologic exam below  NEUROLOGIC: MENTAL STATUS:  No flowsheet data found. awake, alert, oriented to person, place and time recent and remote memory intact normal attention and concentration language fluent, comprehension intact, naming intact fund of knowledge appropriate  CRANIAL NERVE:  2nd - no papilledema on fundoscopic exam 2nd, 3rd, 4th, 6th - pupis equal and reactive to light, visual fields full to confrontation, extraocular muscles intact, no nystagmus 5th - facial sensation symmetric 7th - facial strength symmetric 8th - hearing intact 9th - palate elevates symmetrically, uvula midline 11th - shoulder shrug symmetric 12th - tongue protrusion midline  MOTOR:  normal bulk and tone, full strength in the BUE, BLE  SENSORY:  normal  and symmetric to light touch, temperature, vibration  COORDINATION:  finger-nose-finger, fine finger movements normal  REFLEXES:  deep tendon reflexes present and symmetric  GAIT/STATION:  narrow based gait     DIAGNOSTIC DATA (LABS, IMAGING, TESTING) - I reviewed patient records, labs, notes, testing and imaging myself where available.  Lab Results  Component Value Date   WBC 12.2 (H) 04/18/2021   HGB 13.9 04/18/2021   HCT 40.7 04/18/2021   MCV 98 (H) 04/18/2021   PLT 413 04/18/2021      Component Value Date/Time   NA 141 03/07/2021 1000   K 4.0 03/07/2021 1000   CL 101 03/07/2021 1000   CO2 24 03/07/2021 1000   GLUCOSE 110 (H) 03/07/2021 1000   GLUCOSE 115 (H) 02/28/2021 1805   BUN 3 (L) 03/07/2021 1000   CREATININE 0.56 (L) 03/07/2021 1000   CALCIUM 9.3 03/07/2021 1000   PROT 6.7 03/07/2021 1000   ALBUMIN 4.7 03/11/2021 1455   AST 38  03/07/2021 1000   ALT 48 (H) 03/07/2021 1000   ALKPHOS 158 (H) 03/07/2021 1000   BILITOT 0.7 03/07/2021 1000   GFRNONAA >60 02/28/2021 1805   GFRAA 160 11/04/2020 1449   Lab Results  Component Value Date   CHOL 168 07/10/2021   HDL 31 (L) 07/10/2021   LDLCALC 95 07/10/2021   TRIG 250 (H) 07/10/2021   CHOLHDL 5.4 (H) 07/10/2021   Lab Results  Component Value Date   HGBA1C 5.0 11/04/2020   Lab Results  Component Value Date   VITAMINB12 399 04/09/2021   Lab Results  Component Value Date   TSH 1.330 03/07/2021    02/28/21 MRI brain / orbits [I reviewed images myself and agree with interpretation. -VRP]  1. No acute intracranial abnormality. 2. Questionable hyperintense T2-weighted signal within the cisternal segment of the right optic nerve, equivocal for optic neuritis. 3. No evidence of intracranial demyelinating disease.  03/26/21 MRI cervical spine with and without contrast imaging: -Chronic demyelinating plaques at C3-4 anterior and centrally, and subtle at C5 on the right and subtle C6-7 on the left.   03/26/21   Normal MRI thoracic spine with without contrast.  03/11/21 VEP - The evoked response was mildly to moderately prolonged on the left, implying mild to moderate slowing along the left visual pathway.  The evoked response was absent on the right, implying severe slowing along the right visual pathways.    03/11/21 CSF STUDIES  WBC 13, RBC 0, PROT 27, GLUC 80   Component Ref Range & Units 4 wk ago 1 mo ago  CNS-IgG Synthesis Rate -9.9 - 3.3 mg/24 h 5.1 High     IgG-Index <0.66 1.39 High      OCB - The patient's CSF contains >5 well defined gamma  restriction bands that are not present in the patient's  corresponding serum sample. These bands indicate  abnormal synthesis of gammaglobulins in the central  nervous system. This finding is supportive evidence  of central nervous system inflammation, multiple  sclerosis, or infection and should be interpreted in  conjunction with all clinical and laboratory data  pertaining to this patient.   03/07/21 labs - NMO, ANA, ANCA, HIV, RPR ACE --> NEGATIVE    ASSESSMENT AND PLAN  20 y.o. year old female here with abnormal vision changes in right greater than left eye, abnormal visual field testing, with subtle abnormalities on MRI orbits. Also abnl VEP, MRI cervical spine and LP results. Consistent with demyelinating disease.   Dx:  1. Multiple sclerosis (HCC)     PLAN:  Demyelinating disease (multiple sclerosis) - check JCV ab and other labs - continue vumerity - check MRI brain and cspine in Sept 2023    Orders Placed This Encounter  Procedures   CBC with Differential/Platelet   Comprehensive metabolic panel   VITAMIN D 25 Hydroxy (Vit-D Deficiency, Fractures)   Return in about 9 months (around 07/14/2022).    Suanne Marker, MD 10/13/2021, 1:47 PM Certified in Neurology, Neurophysiology and Neuroimaging  [facility] [address], [address] [place], [place] [address] [phone]

## 2021-10-14 LAB — VITAMIN D 25 HYDROXY (VIT D DEFICIENCY, FRACTURES): Vit D, 25-Hydroxy: 41.1 ng/mL (ref 30.0–100.0)

## 2021-10-14 LAB — CBC WITH DIFFERENTIAL/PLATELET
Basophils Absolute: 0.1 10*3/uL (ref 0.0–0.2)
Basos: 1 %
EOS (ABSOLUTE): 0.2 10*3/uL (ref 0.0–0.4)
Eos: 2 %
Hematocrit: 37.8 % (ref 34.0–46.6)
Hemoglobin: 12.8 g/dL (ref 11.1–15.9)
Immature Grans (Abs): 0.1 10*3/uL (ref 0.0–0.1)
Immature Granulocytes: 1 %
Lymphocytes Absolute: 2.2 10*3/uL (ref 0.7–3.1)
Lymphs: 23 %
MCH: 34.3 pg — ABNORMAL HIGH (ref 26.6–33.0)
MCHC: 33.9 g/dL (ref 31.5–35.7)
MCV: 101 fL — ABNORMAL HIGH (ref 79–97)
Monocytes Absolute: 0.8 10*3/uL (ref 0.1–0.9)
Monocytes: 8 %
Neutrophils Absolute: 6.4 10*3/uL (ref 1.4–7.0)
Neutrophils: 65 %
Platelets: 371 10*3/uL (ref 150–450)
RBC: 3.73 x10E6/uL — ABNORMAL LOW (ref 3.77–5.28)
RDW: 14 % (ref 11.7–15.4)
WBC: 9.8 10*3/uL (ref 3.4–10.8)

## 2021-10-14 LAB — COMPREHENSIVE METABOLIC PANEL
ALT: 31 IU/L (ref 0–32)
AST: 34 IU/L (ref 0–40)
Albumin/Globulin Ratio: 2.6 — ABNORMAL HIGH (ref 1.2–2.2)
Albumin: 4.7 g/dL (ref 3.9–5.0)
Alkaline Phosphatase: 96 IU/L (ref 42–106)
BUN/Creatinine Ratio: 8 — ABNORMAL LOW (ref 9–23)
BUN: 4 mg/dL — ABNORMAL LOW (ref 6–20)
Bilirubin Total: 0.3 mg/dL (ref 0.0–1.2)
CO2: 21 mmol/L (ref 20–29)
Calcium: 8.9 mg/dL (ref 8.7–10.2)
Chloride: 103 mmol/L (ref 96–106)
Creatinine, Ser: 0.52 mg/dL — ABNORMAL LOW (ref 0.57–1.00)
Globulin, Total: 1.8 g/dL (ref 1.5–4.5)
Glucose: 100 mg/dL — ABNORMAL HIGH (ref 70–99)
Potassium: 3.8 mmol/L (ref 3.5–5.2)
Sodium: 141 mmol/L (ref 134–144)
Total Protein: 6.5 g/dL (ref 6.0–8.5)
eGFR: 136 mL/min/{1.73_m2} (ref >59)

## 2021-10-15 ENCOUNTER — Encounter: Payer: Self-pay | Admitting: *Deleted

## 2021-12-10 ENCOUNTER — Other Ambulatory Visit: Payer: Self-pay

## 2021-12-10 ENCOUNTER — Telehealth: Payer: Self-pay | Admitting: Family Medicine

## 2021-12-10 DIAGNOSIS — R41840 Attention and concentration deficit: Secondary | ICD-10-CM

## 2021-12-10 DIAGNOSIS — Z3042 Encounter for surveillance of injectable contraceptive: Secondary | ICD-10-CM

## 2021-12-10 DIAGNOSIS — Z3041 Encounter for surveillance of contraceptive pills: Secondary | ICD-10-CM

## 2021-12-10 MED ORDER — MEDROXYPROGESTERONE ACETATE 150 MG/ML IM SUSP
150.0000 mg | Freq: Once | INTRAMUSCULAR | Status: AC
  Administered 2021-12-17: 150 mg via INTRAMUSCULAR

## 2021-12-10 MED ORDER — MEDROXYPROGESTERONE ACETATE 150 MG/ML IM SUSP: 150.0000 mg | INTRAMUSCULAR | 0 refills | Status: DC

## 2021-12-10 NOTE — Telephone Encounter (Signed)
°  Prescription Request  12/10/2021  Is this a "Controlled Substance" medicine? no  Have you seen your PCP in the last 2 weeks? no  If YES, route message to pool  -  If NO, patient needs to be scheduled for appointment.  What is the name of the medication or equipment? MedroxyProgesterone 150 MG/ML  Have you contacted your pharmacy to request a refill? NO   Which pharmacy would you like this sent to? [facility]   Patient notified that their request is being sent to the clinical staff for review and that they should receive a response within 2 business days.

## 2021-12-10 NOTE — Telephone Encounter (Signed)
Mother aware

## 2021-12-17 ENCOUNTER — Ambulatory Visit (INDEPENDENT_AMBULATORY_CARE_PROVIDER_SITE_OTHER): Payer: BC Managed Care – PPO | Admitting: *Deleted

## 2021-12-17 DIAGNOSIS — Z3041 Encounter for surveillance of contraceptive pills: Secondary | ICD-10-CM | POA: Diagnosis not present

## 2021-12-17 DIAGNOSIS — Z3042 Encounter for surveillance of injectable contraceptive: Secondary | ICD-10-CM

## 2021-12-17 NOTE — Progress Notes (Signed)
Patient in for Depo Provera injection. Administered IM in left upper outer quadrant. Patient tolerated well.

## 2022-01-21 ENCOUNTER — Encounter: Payer: Self-pay | Admitting: Family Medicine

## 2022-01-21 MED ORDER — OMEPRAZOLE 20 MG PO CPDR: DELAYED_RELEASE_CAPSULE | ORAL | 3 refills | Status: DC

## 2022-02-24 ENCOUNTER — Encounter: Payer: Self-pay | Admitting: Diagnostic Neuroimaging

## 2022-02-27 ENCOUNTER — Ambulatory Visit (INDEPENDENT_AMBULATORY_CARE_PROVIDER_SITE_OTHER): Payer: BC Managed Care – PPO | Admitting: Family Medicine

## 2022-02-27 ENCOUNTER — Encounter: Payer: Self-pay | Admitting: Family Medicine

## 2022-02-27 VITALS — BP 138/86 | HR 130 | Temp 96.2°F | Ht 66.0 in | Wt 263.2 lb

## 2022-02-27 DIAGNOSIS — R630 Anorexia: Secondary | ICD-10-CM

## 2022-02-27 DIAGNOSIS — G5793 Unspecified mononeuropathy of bilateral lower limbs: Secondary | ICD-10-CM

## 2022-02-27 DIAGNOSIS — E538 Deficiency of other specified B group vitamins: Secondary | ICD-10-CM

## 2022-02-27 DIAGNOSIS — R000 Tachycardia, unspecified: Secondary | ICD-10-CM

## 2022-02-27 DIAGNOSIS — R112 Nausea with vomiting, unspecified: Secondary | ICD-10-CM | POA: Diagnosis not present

## 2022-02-27 DIAGNOSIS — R5383 Other fatigue: Secondary | ICD-10-CM

## 2022-02-27 DIAGNOSIS — G35 Multiple sclerosis: Secondary | ICD-10-CM

## 2022-02-27 MED ORDER — ONDANSETRON HCL 4 MG PO TABS: 4.0000 mg | ORAL_TABLET | Freq: Three times a day (TID) | ORAL | 0 refills | Status: DC | PRN

## 2022-02-27 MED ORDER — GABAPENTIN 600 MG PO TABS: 300.0000 mg | ORAL_TABLET | Freq: Two times a day (BID) | ORAL | 1 refills | Status: DC

## 2022-02-27 MED ORDER — PROMETHAZINE HCL 25 MG/ML IJ SOLN
25.0000 mg | Freq: Four times a day (QID) | INTRAMUSCULAR | Status: AC | PRN
  Administered 2022-02-27: 25 mg via INTRAMUSCULAR

## 2022-02-27 NOTE — Patient Instructions (Signed)

## 2022-02-27 NOTE — Progress Notes (Signed)
? ?Acute Office Visit ? ?Subjective:  ? ?  ?Patient ID: Debra Copeland, female    DOB: Feb 11, 2001, 21 y.o.   MRN: [id number] ? ?Chief Complaint  ?Patient presents with  ? Nausea  ? ? ?HPI ?Here with mother. Patient is in today for nausea for the last month.  ?The nausea typically occurs after eating. She also has a decreased appetite for the last month.  She is managing a few chicken nuggets a day and that is all. She is trying to drink a lot of fluids, but reports that she is not good at this. She reports cramping with bowel movements, otherwise denies abdominal pain. She does have diarrhea but states this is normal for her. Denies vomiting until now however patient is vomiting in the exam room today. She reports numbness to the touch of her stomach. She does have GERD. She has been taking omeprazole for about 1 year. She has not weighed herself to tell if she has lost weight but per our scales she has lost about 30 lbs over 7 months. Denies fever, constipation, blood in stools. She does not feel like her heart is racing currently. She does not wear a watch or anything that check her heart rate. She has been feeling more fatigue over the last few months. Even taking a shower can make her feel tired. ? ?She reports sharp shooting pain in the top of her feet. They always feel sensitive. She has a red spot on the top of her left foot. She denies numbness or tingling in her feet. She does feel like this makes it a little difficult for her to walk. The pain is worse at night and makes it difficult for her to sleep.  ? ?She has been on vumerity for MS for 8 months or so.  ? ? ?  02/27/2022  ?  4:19 PM 08/12/2021  ?  3:44 PM 07/01/2021  ?  3:16 PM  ?Depression screen PHQ 2/9  ?Decreased Interest _0 ?Down, Depressed, Hopeless 0 0 0  ?PHQ - 2 Score _1 ?Altered sleeping _2 ?Tired, decreased energy _3 ?Change in appetite _4 ?Feeling bad or failure about yourself  0 0 0  ?Trouble concentrating 0 0 3   ?Moving slowly or fidgety/restless 0 0 0  ?Suicidal thoughts 0 0 0  ?PHQ-9 Score _5 ?Difficult doing work/chores Somewhat difficult Not difficult at all Somewhat difficult  ? ? ?  02/27/2022  ?  4:20 PM 08/12/2021  ?  3:45 PM 07/01/2021  ?  3:16 PM 05/09/2021  ?  3:54 PM  ?GAD 7 : Generalized Anxiety Score  ?Nervous, Anxious, on Edge _6 ?Control/stop worrying 0 0 2 3  ?Worry too much - different things 0 0 2 2  ?Trouble relaxing _7 ?Restless _8 ?Easily annoyed or irritable 0 0 0 1  ?Afraid - awful might happen 0 0 1 0  ?Total GAD 7 Score _9 ?Anxiety Difficulty Not difficult at all Somewhat difficult Very difficult Somewhat difficult  ? ? ? ? ? ?ROS ?As per HPI.  ? ?   ?Objective:  ?  ?BP 138/86   Pulse (!) 130   Temp (!) 96.2 ?F (35.7 ?C) (Temporal)   Ht _10  (1.676 m)   Wt 263 lb 4 oz (119.4 kg)  BMI 42.49 kg/m?  ?Wt Readings from Last 3 Encounters:  ?02/27/22 263 lb 4 oz (119.4 kg)  ?08/12/21 290 lb 6.4 oz (131.7 kg)  ?07/01/21 295 lb 3.2 oz (133.9 kg)  ? ? ?Physical Exam ?Vitals and nursing note reviewed.  ?Constitutional:   ?   General: She is not in acute distress. ?   Appearance: She is not ill-appearing, toxic-appearing or diaphoretic.  ?HENT:  ?   Head: Normocephalic and atraumatic.  ?   Nose: Nose normal.  ?   Mouth/Throat:  ?   Mouth: Mucous membranes are moist.  ?   Comments: Dry lips. Oral mucous is moist, pink ?Cardiovascular:  ?   Rate and Rhythm: Regular rhythm. Tachycardia present.  ?   Heart sounds: Normal heart sounds. No murmur heard. ?Pulmonary:  ?   Effort: Pulmonary effort is normal. No respiratory distress.  ?   Breath sounds: Normal breath sounds.  ?Abdominal:  ?   General: Bowel sounds are normal.  ?   Palpations: Abdomen is soft.  ?   Tenderness: There is no abdominal tenderness. There is no guarding or rebound. Negative signs include Murphy's sign and McBurney's sign.  ?Musculoskeletal:  ?   Right lower leg: No edema.  ?   Left lower leg: No edema.   ?Skin: ?   General: Skin is warm and dry.  ?Neurological:  ?   General: No focal deficit present.  ?   Mental Status: She is alert and oriented to person, place, and time.  ?Psychiatric:     ?   Mood and Affect: Mood normal.     ?   Behavior: Behavior normal.  ? ? ?No results found for any visits on 02/27/22. ? ? ?   ?Assessment & Plan:  ? ?Debra Copeland was seen today for nausea. ? ?Diagnoses and all orders for this visit: ? ?Decreased appetite ?Unsure of etiology. Labs pending. Discussed boost drinks to increase calorie intake.  ?-     Anemia Profile B ?-     CMP14+EGFR ?-     TSH ? ?Nausea and vomiting, unspecified vomiting type ?Phenergan today in the office as patient is actively vomiting. Benign abdominal exam. Zofran at home as needed. Labs pending. Discussed if labs are normal, will try to increase prilosec to BID to see if this resolves nausea.  ?-     promethazine (PHENERGAN) injection 25 mg ?-     ondansetron (ZOFRAN) 4 MG tablet; Take 1 tablet (4 mg total) by mouth every 8 (eight) hours as needed for nausea or vomiting. ?-     Anemia Profile B ?-     CMP14+EGFR ?-     Lipase ?-     Amylase ? ?Tachycardia ?Deyhyration? Labs pending. Discussed to monitor HR at home.  ?-     Anemia Profile B ?-     CMP14+EGFR ?-     TSH ? ?Neuropathic pain of both feet ?Discussed common symptom of MS. Will try gabapentin. Will check A1c as well to rule out as cause.  ?-     gabapentin (NEURONTIN) 600 MG tablet; Take 0.5 tablets (300 mg total) by mouth 2 (two) times daily. ?-     Bayer DCA Hb A1c Waived ? ?Vitamin B12 deficiency ?-     Vitamin B12 ? ?Fatigue, unspecified type ?Discussed likely due to poor calories intake. Labs pending.  ?-     Anemia Profile B ?-     CMP14+EGFR ?-     TSH ?-  Vitamin B12 ? ?Multiple sclerosis ([facility]) ?Managed by neurology. Discussed to notify neurology of symptoms.   ? ? ?Return in about 1 week (around 03/06/2022) for follow up for nausea and HR. ? ?The patient indicates understanding of these  issues and agrees with the plan. ? ?Gwenlyn Perking, FNP ? ? ?

## 2022-02-28 LAB — ANEMIA PROFILE B
Basophils Absolute: 0 10*3/uL (ref 0.0–0.2)
Basos: 0 %
EOS (ABSOLUTE): 0.1 10*3/uL (ref 0.0–0.4)
Eos: 1 %
Ferritin: 301 ng/mL — ABNORMAL HIGH (ref 15–150)
Folate: <2 ng/mL — ABNORMAL LOW (ref >3.0)
Hematocrit: 34.6 % (ref 34.0–46.6)
Hemoglobin: 11.9 g/dL (ref 11.1–15.9)
Immature Grans (Abs): 0.1 10*3/uL (ref 0.0–0.1)
Immature Granulocytes: 1 %
Iron Saturation: 88 % (ref 15–55)
Iron: 234 ug/dL — ABNORMAL HIGH (ref 27–159)
Lymphocytes Absolute: 1.5 10*3/uL (ref 0.7–3.1)
Lymphs: 17 %
MCH: 34.4 pg — ABNORMAL HIGH (ref 26.6–33.0)
MCHC: 34.4 g/dL (ref 31.5–35.7)
MCV: 100 fL — ABNORMAL HIGH (ref 79–97)
Monocytes Absolute: 0.7 10*3/uL (ref 0.1–0.9)
Monocytes: 8 %
Neutrophils Absolute: 6.2 10*3/uL (ref 1.4–7.0)
Neutrophils: 73 %
Platelets: 460 10*3/uL — ABNORMAL HIGH (ref 150–450)
RBC: 3.46 x10E6/uL — ABNORMAL LOW (ref 3.77–5.28)
RDW: 15.9 % — ABNORMAL HIGH (ref 11.7–15.4)
Retic Ct Pct: 3.3 % — ABNORMAL HIGH (ref 0.6–2.6)
Total Iron Binding Capacity: 265 ug/dL (ref 250–450)
UIBC: 31 ug/dL — ABNORMAL LOW (ref 131–425)
Vitamin B-12: 1269 pg/mL — ABNORMAL HIGH (ref 232–1245)
WBC: 8.5 10*3/uL (ref 3.4–10.8)

## 2022-02-28 LAB — CMP14+EGFR
ALT: 23 IU/L (ref 0–32)
AST: 33 IU/L (ref 0–40)
Albumin/Globulin Ratio: 2.6 — ABNORMAL HIGH (ref 1.2–2.2)
Albumin: 4.4 g/dL (ref 3.9–5.0)
Alkaline Phosphatase: 106 IU/L (ref 44–121)
BUN/Creatinine Ratio: 9 (ref 9–23)
BUN: 5 mg/dL — ABNORMAL LOW (ref 6–20)
Bilirubin Total: 0.7 mg/dL (ref 0.0–1.2)
CO2: 21 mmol/L (ref 20–29)
Calcium: 9.4 mg/dL (ref 8.7–10.2)
Chloride: 102 mmol/L (ref 96–106)
Creatinine, Ser: 0.58 mg/dL (ref 0.57–1.00)
Globulin, Total: 1.7 g/dL (ref 1.5–4.5)
Glucose: 110 mg/dL — ABNORMAL HIGH (ref 70–99)
Potassium: 4 mmol/L (ref 3.5–5.2)
Sodium: 144 mmol/L (ref 134–144)
Total Protein: 6.1 g/dL (ref 6.0–8.5)
eGFR: 132 mL/min/{1.73_m2} (ref >59)

## 2022-02-28 LAB — AMYLASE: Amylase: 6 U/L — ABNORMAL LOW (ref 31–110)

## 2022-02-28 LAB — TSH: TSH: 1.63 u[IU]/mL (ref 0.450–4.500)

## 2022-02-28 LAB — LIPASE: Lipase: 12 U/L — ABNORMAL LOW (ref 14–72)

## 2022-03-02 LAB — BAYER DCA HB A1C WAIVED: HB A1C (BAYER DCA - WAIVED): 4.7 % — ABNORMAL LOW (ref 4.8–5.6)

## 2022-03-05 ENCOUNTER — Encounter: Payer: Self-pay | Admitting: Family Medicine

## 2022-03-05 ENCOUNTER — Ambulatory Visit (INDEPENDENT_AMBULATORY_CARE_PROVIDER_SITE_OTHER): Payer: BC Managed Care – PPO | Admitting: Family Medicine

## 2022-03-05 VITALS — BP 127/85 | HR 112 | Temp 98.0°F | Ht 66.0 in | Wt 262.2 lb

## 2022-03-05 DIAGNOSIS — Z3042 Encounter for surveillance of injectable contraceptive: Secondary | ICD-10-CM | POA: Diagnosis not present

## 2022-03-05 DIAGNOSIS — G629 Polyneuropathy, unspecified: Secondary | ICD-10-CM | POA: Diagnosis not present

## 2022-03-05 DIAGNOSIS — G35 Multiple sclerosis: Secondary | ICD-10-CM | POA: Diagnosis not present

## 2022-03-05 MED ORDER — ALPHA-LIPOIC ACID 600 MG PO CAPS: 600.0000 mg | ORAL_CAPSULE | Freq: Every day | ORAL | 1 refills | Status: DC

## 2022-03-05 MED ORDER — MEDROXYPROGESTERONE ACETATE 150 MG/ML IM SUSP: 150.0000 mg | INTRAMUSCULAR | 0 refills | Status: DC

## 2022-03-05 NOTE — Progress Notes (Signed)
? ?Subjective: ?CC: Follow-up neuropathy ?PCP: Debra Ip, DO ?[name]:Debra Copeland is a 21 y.o. female who is accompanied today's visit by her mother.  She is presenting to clinic today for: ? ?1.  Neuropathy ?Patient was seen by one of the nurse practitioners about a week ago for neuropathy, decreased appetite etc.  She was found to be dehydrated.  Gabapentin 300 mg twice daily was started but she has not really noticed any difference in the neuropathy.  She reports numbness and burning at bilateral feet.  All of her labs were normal.  When she reached out to her neurologist office the nurse there did not feel that this was consistent with multiple sclerosis symptoms.  She subsequently has not seen her neurologist. ? ?She has been hydrating with Pedialyte, Gatorade and water.  She notes some improvement in her appetite.  Heart rate at the highest has been 128 and at the lowest in the 90s. ? ? ?ROS: Per HPI ? ?No Known Allergies ?Past Medical History:  ?Diagnosis Date  ? Anxiety   ? Depression   ? ? ?Current Outpatient Medications:  ?  citalopram (CELEXA) 40 MG tablet, Take 1 tablet (40 mg total) by mouth daily., Disp: 90 tablet, Rfl: 3 ?  Cyanocobalamin (VITAMIN B-12 PO), Take by mouth., Disp: , Rfl:  ?  Diroximel Fumarate (VUMERITY) 231 MG CPDR, Take 462 mg by mouth 2 (two) times daily., Disp: , Rfl:  ?  gabapentin (NEURONTIN) 600 MG tablet, Take 0.5 tablets (300 mg total) by mouth 2 (two) times daily., Disp: 120 tablet, Rfl: 1 ?  hydrOXYzine (ATARAX/VISTARIL) 25 MG tablet, Take 1-2 tablets (25-50 mg total) by mouth 3 (three) times daily as needed., Disp: 90 tablet, Rfl: 3 ?  medroxyPROGESTERone (DEPO-PROVERA) 150 MG/ML injection, Inject 1 mL (150 mg total) into the muscle every 3 (three) months. (NEEDS TO BE SEEN BEFORE NEXT REFILL), Disp: 3 mL, Rfl: 0 ?  omeprazole (PRILOSEC) 20 MG capsule, TAKE 1 CAPSULE BY MOUTH ONCE DAILY FOR ACID REFLUX, Disp: 90 capsule, Rfl: 3 ?  ondansetron (ZOFRAN) 4 MG  tablet, Take 1 tablet (4 mg total) by mouth every 8 (eight) hours as needed for nausea or vomiting., Disp: 20 tablet, Rfl: 0 ?  VITAMIN D, CHOLECALCIFEROL, PO, Take by mouth., Disp: , Rfl:  ? ?Current Facility-Administered Medications:  ?  medroxyPROGESTERone (DEPO-PROVERA) injection 150 mg, 150 mg, Intramuscular, Q90 days, Debra Flavin M, DO, 150 mg at 09/26/21 1603 ?Social History  ? ?Socioeconomic History  ? Marital status: Single  ?  Spouse name: Not on file  ? Number of children: Not on file  ? Years of education: Not on file  ? Highest education level: Not on file  ?Occupational History  ? Not on file  ?Tobacco Use  ? Smoking status: Never  ? Smokeless tobacco: Never  ?Vaping Use  ? Vaping Use: Never used  ?Substance and Sexual Activity  ? Alcohol use: Yes  ?  Comment: occ  ? Drug use: Never  ? Sexual activity: Not Currently  ?  Birth control/protection: Pill, Injection  ?Other Topics Concern  ? Not on file  ?Social History Narrative  ? Lives with mom dad and brother  ? Left Handed  ? Drinks 2-3 cups caffeine daily  ? ?Social Determinants of Health  ? ?Financial Resource Strain: Not on file  ?Food Insecurity: Not on file  ?Transportation Needs: Not on file  ?Physical Activity: Not on file  ?Stress: Not on file  ?Social Connections:  Not on file  ?Intimate Partner Violence: Not on file  ? ?Family History  ?Problem Relation Age of Onset  ? Hypertension Father   ? Hypothyroidism Father   ? Vitamin D deficiency Father   ? Bipolar disorder Maternal Uncle   ? Bipolar disorder Maternal Grandfather   ? Depression Paternal Grandmother   ? ? ?Objective: ?Office vital signs reviewed. ?BP 127/85   Pulse (!) 112   Temp 98 ?F (36.7 ?C)   Ht 5\' 6"  (1.676 Copeland)   Wt 262 lb 3.2 oz (118.9 kg)   SpO2 96%   BMI 42.32 kg/Copeland?  ? ?Physical Examination:  ?General: Awake, alert, morbidly obese, No acute distress ?HEENT: Moist mucous membranes ?Cardio: Mildly elevated rate with regular rhythm ?Pulm: Normal work of breathing on  room air ?MSK: Ambulating independently.  Gait is wide-based ? ?Assessment/ Plan: ?21 y.o. female  ? ?Neuropathy - Plan: Alpha-Lipoic Acid 600 MG CAPS ? ?Multiple sclerosis ([facility]) - Plan: Alpha-Lipoic Acid 600 MG CAPS ? ?Encounter for surveillance of injectable contraceptive - Plan: medroxyPROGESTERone (DEPO-PROVERA) 150 MG/ML injection ? ?Uncertain etiology of the neuropathy.  I would lean towards this being related to her multiple sclerosis since there is been no evidence to support metabolic etiology.  I have discussed with her consideration for something like alpha lipoic acid even though this technically has not been studied outside of diabetic neuropathy.  The alternative would be to advance her gabapentin dose but she would like to try the supplement first ? ?No orders of the defined types were placed in this encounter. ? ?No orders of the defined types were placed in this encounter. ? ? ? ?36, DO ?[facility] ?[phone] ? ?

## 2022-03-10 ENCOUNTER — Ambulatory Visit: Payer: BC Managed Care – PPO | Admitting: Diagnostic Neuroimaging

## 2022-03-10 ENCOUNTER — Encounter: Payer: Self-pay | Admitting: Diagnostic Neuroimaging

## 2022-03-10 VITALS — BP 118/80 | HR 68 | Ht 66.0 in | Wt 261.1 lb

## 2022-03-10 DIAGNOSIS — R269 Unspecified abnormalities of gait and mobility: Secondary | ICD-10-CM

## 2022-03-10 DIAGNOSIS — R200 Anesthesia of skin: Secondary | ICD-10-CM | POA: Diagnosis not present

## 2022-03-10 NOTE — Progress Notes (Signed)
? ?[facility] ? ?PATIENT: Debra Copeland ?DOB: 25-Oct-2001 ? ?REFERRING CLINICIAN: Raliegh Ip, DO ?HISTORY FROM: patient  ?REASON FOR VISIT: follow up  ? ? ?HISTORICAL ? ?CHIEF COMPLAINT:  ?Chief Complaint  ?Patient presents with  ? Multiple Sclerosis  ?  Rm 7 early FU d/t new symptoms, Mom- Sabrina  "feet very sensitive, bottom of both feet almost numb, hard to walk in general due to pain, bad balance, I fell this morning"   ? ? ? ?HISTORY OF PRESENT ILLNESS:  ? ?UPDATE (03/10/22, VRP): Since last visit, doing well until 1 month ago; then loss of appetite, numbness in abdomen, legs, feet. Also gait diff. Also right eye blurred vision. Tolerating vumerity, with some flushing.  ? ?UPDATE (10/13/21, VRP): Since last visit, doing well, now on vumerity. Vision better. Some muscle spasms in neck and hands.  ? ?UPDATE (04/09/21, VRP): Since last visit, doing a little better. Sxs worse with heat or activity. Some mild vision changes and numbness in hands. No other alleviating or aggravating factors.    ? ?PRIOR HPI: 21 year old female here for evaluation of vision changes. ? ?Around January 17, 2021 patient noticed abnormal vision mainly from her right eye.  She felt that her central vision was decreased in her right eye when she closed her left eye, but she would see "extra brightness" in this region.  No spots or sparkles.  No dark spots or gaps in visual field.  No pain or headache.  Symptoms continued and slightly affected the left eye.  Patient went to ophthalmology on 02/28/2021 and exam was notable for decreased visual field testing in right greater than left eye, mainly inferior fields.  Patient was diagnosed with possible optic neuritis and sent to the ER for evaluation.  MRI of the brain and orbits was obtained.  Very subtle T2 hyperintensity was noted within the bilateral optic chiasm and proximal optic nerves, right greater than left side.  No abnormal enhancement noted.  She was  recommended to be admitted for further testing and lumbar puncture but patient wanted to go home and follow-up with outpatient neurology. ? ?Patient has had some numbness and cramps in her hands.  No other prodromal accidents injuries or traumas. ? ? ?REVIEW OF SYSTEMS: Full 14 system review of systems performed and negative with exception of: As per HPI. ? ?ALLERGIES: ?No Known Allergies ? ?HOME MEDICATIONS: ?Outpatient Medications Prior to Visit  ?Medication Sig Dispense Refill  ? Alpha-Lipoic Acid 600 MG CAPS Take 1 capsule (600 mg total) by mouth daily. For neuropathy 90 capsule 1  ? citalopram (CELEXA) 40 MG tablet Take 1 tablet (40 mg total) by mouth daily. 90 tablet 3  ? Cyanocobalamin (VITAMIN B-12 PO) Take by mouth.    ? Diroximel Fumarate (VUMERITY) 231 MG CPDR Take 462 mg by mouth 2 (two) times daily.    ? hydrOXYzine (ATARAX/VISTARIL) 25 MG tablet Take 1-2 tablets (25-50 mg total) by mouth 3 (three) times daily as needed. 90 tablet 3  ? medroxyPROGESTERone (DEPO-PROVERA) 150 MG/ML injection Inject 1 mL (150 mg total) into the muscle every 3 (three) months. (NEEDS TO BE SEEN BEFORE NEXT REFILL) 3 mL 0  ? omeprazole (PRILOSEC) 20 MG capsule TAKE 1 CAPSULE BY MOUTH ONCE DAILY FOR ACID REFLUX 90 capsule 3  ? ondansetron (ZOFRAN) 4 MG tablet Take 1 tablet (4 mg total) by mouth every 8 (eight) hours as needed for nausea or vomiting. 20 tablet 0  ? VITAMIN D, CHOLECALCIFEROL, PO Take  by mouth.    ? gabapentin (NEURONTIN) 600 MG tablet Take 0.5 tablets (300 mg total) by mouth 2 (two) times daily. (Patient not taking: Reported on 03/10/2022) 120 tablet 1  ? ?Facility-Administered Medications Prior to Visit  ?Medication Dose Route Frequency Provider Last Rate Last Admin  ? medroxyPROGESTERone (DEPO-PROVERA) injection 150 mg  150 mg Intramuscular Q90 days Delynn Flavin M, DO   150 mg at 09/26/21 1603  ? ? ?PAST MEDICAL HISTORY: ?Past Medical History:  ?Diagnosis Date  ? Anxiety   ? Depression   ? Multiple  sclerosis (HCC)   ? ? ?PAST SURGICAL HISTORY: ?No past surgical history on file. ? ?FAMILY HISTORY: ?Family History  ?Problem Relation Age of Onset  ? Hypertension Father   ? Hypothyroidism Father   ? Vitamin D deficiency Father   ? Bipolar disorder Maternal Uncle   ? Bipolar disorder Maternal Grandfather   ? Depression Paternal Grandmother   ? ? ?SOCIAL HISTORY: ?Social History  ? ?Socioeconomic History  ? Marital status: Single  ?  Spouse name: Not on file  ? Number of children: 0  ? Years of education: Not on file  ? Highest education level: Not on file  ?Occupational History  ? Not on file  ?Tobacco Use  ? Smoking status: Never  ? Smokeless tobacco: Never  ?Vaping Use  ? Vaping Use: Never used  ?Substance and Sexual Activity  ? Alcohol use: Yes  ?  Comment: occ  ? Drug use: Never  ? Sexual activity: Not Currently  ?  Birth control/protection: Pill, Injection  ?Other Topics Concern  ? Not on file  ?Social History Narrative  ? Lives with mom dad and brother  ? Left Handed  ? Drinks 2-3 cups caffeine daily  ? ?Social Determinants of Health  ? ?Financial Resource Strain: Not on file  ?Food Insecurity: Not on file  ?Transportation Needs: Not on file  ?Physical Activity: Not on file  ?Stress: Not on file  ?Social Connections: Not on file  ?Intimate Partner Violence: Not on file  ? ? ? ?PHYSICAL EXAM ? ?GENERAL EXAM/CONSTITUTIONAL: ?Vitals:  ?Vitals:  ? 03/10/22 1429  ?BP: 118/80  ?Pulse: 68  ?Weight: 261 lb 1.6 oz (118.4 kg)  ?Height: 5\' 6"  (1.676 m)  ? ? ?Body mass index is 42.14 kg/m?. ?Wt Readings from Last 3 Encounters:  ?03/10/22 261 lb 1.6 oz (118.4 kg)  ?03/05/22 262 lb 3.2 oz (118.9 kg)  ?02/27/22 263 lb 4 oz (119.4 kg)  ? ?Patient is in no distress; well developed, nourished and groomed; neck is supple ? ?CARDIOVASCULAR: ?Examination of carotid arteries is normal; no carotid bruits ?Regular rate and rhythm, no murmurs ?Examination of peripheral vascular system by observation and palpation is  normal ? ?EYES: ?Ophthalmoscopic exam of optic discs and posterior segments is normal; no papilledema or hemorrhages ?No results found. ? ?MUSCULOSKELETAL: ?Gait, strength, tone, movements noted in Neurologic exam below ? ?NEUROLOGIC: ?MENTAL STATUS:  ?   ? View : No data to display.  ?  ?  ?  ? ?awake, alert, oriented to person, place and time ?recent and remote memory intact ?normal attention and concentration ?language fluent, comprehension intact, naming intact ?fund of knowledge appropriate ? ?CRANIAL NERVE:  ?2nd - no papilledema on fundoscopic exam ?2nd, 3rd, 4th, 6th - pupis equal and reactive to light, visual fields full to confrontation, extraocular muscles intact, no nystagmus ?5th - facial sensation symmetric ?7th - facial strength symmetric ?8th - hearing intact ?9th - palate elevates  symmetrically, uvula midline ?11th - shoulder shrug symmetric ?12th - tongue protrusion midline ? ?MOTOR:  ?normal bulk and tone, full strength in the BUE, BLE ? ?SENSORY:  ?normal and symmetric to light touch, temperature, vibration; DECR IN FEET ? ?COORDINATION:  ?finger-nose-finger, fine finger movements normal ? ?REFLEXES:  ?deep tendon reflexes present and symmetric ? ?GAIT/STATION:  ?UNSTEADY GAIT; SLOW STEPS ? ? ? ? ?DIAGNOSTIC DATA (LABS, IMAGING, TESTING) ?- I reviewed patient records, labs, notes, testing and imaging myself where available. ? ?Lab Results  ?Component Value Date  ? WBC 8.5 02/27/2022  ? HGB 11.9 02/27/2022  ? HCT 34.6 02/27/2022  ? MCV 100 (H) 02/27/2022  ? PLT 460 (H) 02/27/2022  ? ?   ?Component Value Date/Time  ? NA 144 02/27/2022 1649  ? K 4.0 02/27/2022 1649  ? CL 102 02/27/2022 1649  ? CO2 21 02/27/2022 1649  ? GLUCOSE 110 (H) 02/27/2022 1649  ? GLUCOSE 115 (H) 02/28/2021 1805  ? BUN 5 (L) 02/27/2022 1649  ? CREATININE 0.58 02/27/2022 1649  ? CALCIUM 9.4 02/27/2022 1649  ? PROT 6.1 02/27/2022 1649  ? ALBUMIN 4.4 02/27/2022 1649  ? ALBUMIN 4.7 03/11/2021 1455  ? AST 33 02/27/2022 1649  ? ALT  23 02/27/2022 1649  ? ALKPHOS 106 02/27/2022 1649  ? BILITOT 0.7 02/27/2022 1649  ? GFRNONAA >60 02/28/2021 1805  ? GFRAA 160 11/04/2020 1449  ? ?Lab Results  ?Component Value Date  ? CHOL 168 07/10/2021  ? HDL 31 (L) 07/10/2021  ?

## 2022-03-11 ENCOUNTER — Ambulatory Visit (INDEPENDENT_AMBULATORY_CARE_PROVIDER_SITE_OTHER): Payer: BC Managed Care – PPO | Admitting: *Deleted

## 2022-03-11 DIAGNOSIS — Z3042 Encounter for surveillance of injectable contraceptive: Secondary | ICD-10-CM | POA: Diagnosis not present

## 2022-03-11 MED ORDER — MEDROXYPROGESTERONE ACETATE 150 MG/ML IM SUSP
150.0000 mg | Freq: Once | INTRAMUSCULAR | Status: AC
  Administered 2022-03-11: 150 mg via INTRAMUSCULAR

## 2022-03-12 ENCOUNTER — Telehealth: Payer: Self-pay | Admitting: Diagnostic Neuroimaging

## 2022-03-12 NOTE — Telephone Encounter (Signed)
MR Brain w/wo contrast & MR Thoracic spine w/wo contrast Dr. Theresa Mulligan Berkley Harvey: [phone] & [phone] (exp. 03/12/22 to 04/10/22). Patient is scheduled at [facility] for 03/17/22. ?

## 2022-03-17 ENCOUNTER — Ambulatory Visit: Payer: BC Managed Care – PPO

## 2022-03-17 DIAGNOSIS — R200 Anesthesia of skin: Secondary | ICD-10-CM

## 2022-03-17 DIAGNOSIS — R269 Unspecified abnormalities of gait and mobility: Secondary | ICD-10-CM

## 2022-03-17 MED ORDER — GADOBENATE DIMEGLUMINE 529 MG/ML IV SOLN
20.0000 mL | Freq: Once | INTRAVENOUS | Status: AC | PRN
  Administered 2022-03-17: 20 mL via INTRAVENOUS

## 2022-03-19 ENCOUNTER — Encounter: Payer: Self-pay | Admitting: Neurology

## 2022-03-19 NOTE — Telephone Encounter (Signed)
Any suggestions for her foot pain?  ?

## 2022-03-23 LAB — STRATIFY JCV(TM) AB W/INDEX
JCV Antibody: NEGATIVE
JCV Index Value: 0.18

## 2022-04-13 ENCOUNTER — Encounter: Payer: Self-pay | Admitting: Family Medicine

## 2022-04-13 ENCOUNTER — Ambulatory Visit (INDEPENDENT_AMBULATORY_CARE_PROVIDER_SITE_OTHER): Payer: BC Managed Care – PPO

## 2022-04-13 ENCOUNTER — Ambulatory Visit (INDEPENDENT_AMBULATORY_CARE_PROVIDER_SITE_OTHER): Payer: BC Managed Care – PPO | Admitting: Family Medicine

## 2022-04-13 VITALS — BP 151/102 | HR 110 | Temp 97.0°F | Ht 66.0 in | Wt 258.8 lb

## 2022-04-13 DIAGNOSIS — S93492A Sprain of other ligament of left ankle, initial encounter: Secondary | ICD-10-CM | POA: Diagnosis not present

## 2022-04-13 DIAGNOSIS — S99912A Unspecified injury of left ankle, initial encounter: Secondary | ICD-10-CM | POA: Diagnosis not present

## 2022-04-13 DIAGNOSIS — M79672 Pain in left foot: Secondary | ICD-10-CM

## 2022-04-13 NOTE — Patient Instructions (Signed)
Ankle Sprain  An ankle sprain is a stretch or tear in one of the tough tissues (ligaments) that connect the bones in your ankle. An ankle sprain can happen when the ankle rolls outward (inversion sprain) or inward (eversion sprain). What are the causes? This condition is caused by rolling or twisting the ankle. What increases the risk? You are more likely to develop this condition if you play sports. What are the signs or symptoms? Symptoms of this condition include: Pain in your ankle. Swelling. Bruising. This may happen right after you sprain your ankle or 1-2 days later. Trouble standing or walking. How is this diagnosed? This condition is diagnosed with: A physical exam. During the exam, your doctor will press on certain parts of your foot and ankle and try to move them in certain ways. X-ray imaging. These may be taken to see how bad the sprain is and to check for broken bones. How is this treated? This condition may be treated with: A brace or splint. This is used to keep the ankle from moving until it heals. An elastic bandage. This is used to support the ankle. Crutches. Pain medicine. Surgery. This may be needed if the sprain is very bad. Physical therapy. This may help to improve movement in the ankle. Follow these instructions at home: If you have a brace or a splint: Wear the brace or splint as told by your doctor. Remove it only as told by your doctor. Loosen the brace or splint if your toes: Tingle. Lose feeling (become numb). Turn cold and blue. Keep the brace or splint clean. If the brace or splint is not waterproof: Do not let it get wet. Cover it with a watertight covering when you take a bath or a shower. If you have an elastic bandage (dressing): Remove it to shower or bathe. Try not to move your ankle much, but wiggle your toes from time to time. This helps to prevent swelling. Adjust the dressing if it feels too tight. Loosen the dressing if your  foot: Loses feeling. Tingles. Becomes cold and blue. Managing pain, stiffness, and swelling  Take over-the-counter and prescription medicines only as told by doctor. For 2-3 days, keep your ankle raised (elevated) above the level of your heart. If told, put ice on the injured area: If you have a removable brace or splint, remove it as told by your doctor. Put ice in a plastic bag. Place a towel between your skin and the bag. Leave the ice on for 20 minutes, 2-3 times a day. General instructions Rest your ankle. Do not use your injured leg to support your body weight until your doctor says that you can. Use crutches as told by your doctor. Do not use any products that contain nicotine or tobacco, such as cigarettes, e-cigarettes, and chewing tobacco. If you need help quitting, ask your doctor. Keep all follow-up visits as told by your doctor. Contact a doctor if: Your bruises or swelling are quickly getting worse. Your pain does not get better after you take medicine. Get help right away if: You cannot feel your toes or foot. Your foot or toes look blue. You have very bad pain that gets worse. Summary An ankle sprain is a stretch or tear in one of the tough tissues (ligaments) that connect the bones in your ankle. This condition is caused by rolling or twisting the ankle. Symptoms include pain, swelling, bruising, and trouble walking. To help with pain and swelling, put ice on the injured   ankle, raise your ankle above the level of your heart, and use an elastic bandage. Also, rest as told by your doctor. Keep all follow-up visits as told by your doctor. This is important. This information is not intended to replace advice given to you by your health care provider. Make sure you discuss any questions you have with your health care provider. Document Revised: 12/19/2020 Document Reviewed: 12/19/2020 Elsevier Patient Education  2023 [employer]  

## 2022-04-13 NOTE — Progress Notes (Signed)
Subjective: CC:?sprain PCP: Raliegh Ip, DO ZOX:[id number] B Debra Copeland is a 21 y.o. female presenting to clinic today for:  1. Left ankle injury Patient reports that she is having multiple episodes of falls.  This typically seems to be associated with her left ankle giving out.  This occurred initially a month ago and she had a slight sprain to the left ankle at that time but it got better.  About 8 days ago it occurred again but unfortunately this time she fell and hurt herself.  She points to the lateral left ankle and forefoot as the area of pain.  She is treated with gabapentin for pain associated with multiple sclerosis, which was recently increased in dose, but does not feel that she is experiencing any concerning sedation from that med.  She denies any weakness necessarily in the lower extremities when this occurs.  She is able to ambulate but notes that she has quite a bit of pain.  She has been utilizing an ice boot as well as compression wrap with some improvement in discomfort.   ROS: Per HPI  No Known Allergies Past Medical History:  Diagnosis Date   Anxiety    Depression    Multiple sclerosis (HCC)     Current Outpatient Medications:    Alpha-Lipoic Acid 600 MG CAPS, Take 1 capsule (600 mg total) by mouth daily. For neuropathy, Disp: 90 capsule, Rfl: 1   citalopram (CELEXA) 40 MG tablet, Take 1 tablet (40 mg total) by mouth daily., Disp: 90 tablet, Rfl: 3   Cyanocobalamin (VITAMIN B-12 PO), Take by mouth., Disp: , Rfl:    Diroximel Fumarate (VUMERITY) 231 MG CPDR, Take 462 mg by mouth 2 (two) times daily., Disp: , Rfl:    hydrOXYzine (ATARAX/VISTARIL) 25 MG tablet, Take 1-2 tablets (25-50 mg total) by mouth 3 (three) times daily as needed., Disp: 90 tablet, Rfl: 3   medroxyPROGESTERone (DEPO-PROVERA) 150 MG/ML injection, Inject 1 mL (150 mg total) into the muscle every 3 (three) months. (NEEDS TO BE SEEN BEFORE NEXT REFILL), Disp: 3 mL, Rfl: 0   omeprazole (PRILOSEC) 20  MG capsule, TAKE 1 CAPSULE BY MOUTH ONCE DAILY FOR ACID REFLUX, Disp: 90 capsule, Rfl: 3   ondansetron (ZOFRAN) 4 MG tablet, Take 1 tablet (4 mg total) by mouth every 8 (eight) hours as needed for nausea or vomiting., Disp: 20 tablet, Rfl: 0   VITAMIN D, CHOLECALCIFEROL, PO, Take by mouth., Disp: , Rfl:    gabapentin (NEURONTIN) 600 MG tablet, Take 0.5 tablets (300 mg total) by mouth 2 (two) times daily. (Patient not taking: Reported on 03/10/2022), Disp: 120 tablet, Rfl: 1  Current Facility-Administered Medications:    medroxyPROGESTERone (DEPO-PROVERA) injection 150 mg, 150 mg, Intramuscular, Q90 days, Cabell Lazenby M, DO, 150 mg at 09/26/21 1603 Social History   Socioeconomic History   Marital status: Single    Spouse name: Not on file   Number of children: 0   Years of education: Not on file   Highest education level: Not on file  Occupational History   Not on file  Tobacco Use   Smoking status: Never   Smokeless tobacco: Never  Vaping Use   Vaping Use: Never used  Substance and Sexual Activity   Alcohol use: Yes    Comment: occ   Drug use: Never   Sexual activity: Not Currently    Birth control/protection: Pill, Injection  Other Topics Concern   Not on file  Social History Narrative   Lives with mom  dad and brother   Left Handed   Drinks 2-3 cups caffeine daily   Social Determinants of Health   Financial Resource Strain: Not on file  Food Insecurity: Not on file  Transportation Needs: Not on file  Physical Activity: Not on file  Stress: Not on file  Social Connections: Not on file  Intimate Partner Violence: Not on file   Family History  Problem Relation Age of Onset   Hypertension Father    Hypothyroidism Father    Vitamin D deficiency Father    Bipolar disorder Maternal Uncle    Bipolar disorder Maternal Grandfather    Depression Paternal Grandmother     Objective: Office vital signs reviewed. BP (!) 151/102   Pulse (!) 110   Temp (!) 97 F (36.1  C)   Ht 5\' 6"  (1.676 m)   Wt 258 lb 12.8 oz (117.4 kg)   SpO2 93%   BMI 41.77 kg/m   Physical Examination:  General: Awake, alert, morbidly obese, No acute distress HEENT: Cushingoid habitus Cardio: Slightly tachycardic Pulm: Normal work of breathing on room air Extremities: warm, well perfused, No edema, cyanosis or clubbing; +2 pulses bilaterally MSK: Antalgic gait and station.  She has a moderate soft tissue swelling along the ankle, particularly along the distal malleolus.  She has exquisite tenderness to palpation over the distal malleolus and ATFL.  Mild tenderness palpation over the posterior and inferior ankle.  There is no palpable bony abnormalities but exam is limited by pain.  She has ecchymosis between the digits along the forefoot but tenderness is predominantly over the metatarsals and not along the phalanges.  DG Ankle Complete Left  Result Date: 04/13/2022 CLINICAL DATA:  Trauma, fall EXAM: LEFT ANKLE COMPLETE - 3+ VIEW COMPARISON:  None Available. FINDINGS: No fracture or dislocation is seen. There is marked soft tissue swelling over the lateral malleolus. IMPRESSION: No recent fracture or dislocation is seen. Electronically Signed   By: 06/13/2022 M.D.   On: 04/13/2022 14:43   DG Foot Complete Left  Result Date: 04/13/2022 CLINICAL DATA:  Trauma, pain EXAM: LEFT FOOT - COMPLETE 3+ VIEW COMPARISON:  None Available. FINDINGS: No fracture or dislocation is seen. There are no opaque foreign bodies. IMPRESSION: No fracture or dislocation is seen in the left foot. Electronically Signed   By: 06/13/2022 M.D.   On: 04/13/2022 14:44     Assessment/ Plan: 21 y.o. female   Sprain of anterior talofibular ligament of left ankle, initial encounter  Injury of left ankle, initial encounter - Plan: DG Ankle Complete Left, Ambulatory referral to Sports Medicine  Left foot pain - Plan: DG Foot Complete Left, Ambulatory referral to Sports Medicine  Personal view of  the x-rays demonstrated no acute fractures but soft tissue swelling is appreciable.  I have a feeling that she in fact has may be a partial tear of the ATFL and for this reason I am urgently referring her to sports medicine for direct visualization under ultrasound.  I have coordinated her care with Dr. 36 who will try and work her in this week.  I have given the information to her mother into the patient so they may call and coordinate an appointment ASAP.  I did try to place her in a walking boot but this seemed to cause too much pressure over the distal malleolus so we instead wrapped her in an Ace bandage and showed her how to do an appropriate wrap.  I advised her to continue utilizing  this daily so that she has some compression and stability of that ankle.  She may continue icing the affected area and using oral analgesic of her choice.  No orders of the defined types were placed in this encounter.  No orders of the defined types were placed in this encounter.    Raliegh Ip, DO [facility] [phone]

## 2022-04-20 ENCOUNTER — Ambulatory Visit: Payer: Self-pay

## 2022-04-20 ENCOUNTER — Ambulatory Visit (INDEPENDENT_AMBULATORY_CARE_PROVIDER_SITE_OTHER): Payer: BC Managed Care – PPO | Admitting: Family Medicine

## 2022-04-20 VITALS — BP 142/86 | Ht 66.0 in | Wt 253.0 lb

## 2022-04-20 DIAGNOSIS — M25572 Pain in left ankle and joints of left foot: Secondary | ICD-10-CM | POA: Diagnosis not present

## 2022-04-20 NOTE — Patient Instructions (Signed)
You have an ankle sprain and partial tear of a tendon on the outside of your ankle (peroneal tendon) Ice the area for 15 minutes at a time, 3-4 times a day Aleve 2 tabs twice a day with food OR ibuprofen 3 tabs three times a day with food for pain and inflammation as needed. Elevate above the level of your heart when possible Use laceup brace when up and walking around to help with stability while you recover from this injury. Come out of the brace twice a day to do Up/down and alphabet exercises 2-3 sets of each. Start theraband strengthening exercises when directed - once a day 3 sets of 10. Consider physical therapy for strengthening and balance exercises. If not improving as expected, we may repeat x-rays or consider further testing like an MRI. Follow up in 4 weeks.

## 2022-04-20 NOTE — Progress Notes (Signed)
   Debra Copeland is a 21 y.o. female who presents to [facility] today for the following:  Left Ankle Inversion Sprain:  Occurred about 2 weeks ago as she was walking to the bathroom.  Noticed immediate swelling and bruising to the lateral ankle.  She was able to hobble around after the injury but with discomfort.  She has been using a compression brace with ice which has helped with the swelling and pain.  She notes that she has a history of MS and has balance issues at baseline- typically walks with a walking stick. She reports several inversion ankle sprains, happen more commonly to her left ankle. Reassuringly, she feels that the pain and swelling are improving. She did have x-rays on 6/5 of her foot and ankle which were negative for fractures.    PMH reviewed.  ROS as above. Medications reviewed.  Exam:  BP (!) 142/86   Ht 5\' 6"  (1.676 m)   Wt 253 lb (114.8 kg)   BMI 40.84 kg/m  Gen: Well NAD MSK: Left Ankle: Inspection: Slight ecchymosis and edema Palpation: No pain at base of 5th MT; No tenderness over cuboid; No tenderness over navicular No tenderness on posterior aspects of lateral and medial malleolus Mild tenderness to distal fibular shaft, peroneal tendons. Talar dome nontender; Range of motion is full in all directions. Strength is 5/5 in all directions - pain external rotation. Stable lateral and medial ligaments; squeeze test and kleiger test unremarkable; No significant hypermobility in testing ankle ligaments Peroneal tendon laxity but no subluxation. Able to walk 4 steps.   DG Foot Complete Left  Result Date: 04/13/2022 CLINICAL DATA:  Trauma, pain EXAM: LEFT FOOT - COMPLETE 3+ VIEW COMPARISON:  None Available. FINDINGS: No fracture or dislocation is seen. There are no opaque foreign bodies. IMPRESSION: No fracture or dislocation is seen in the left foot. Electronically Signed   By: 06/13/2022 M.D.   On: 04/13/2022 14:44   DG Ankle Complete Left  Result  Date: 04/13/2022 CLINICAL DATA:  Trauma, fall EXAM: LEFT ANKLE COMPLETE - 3+ VIEW COMPARISON:  None Available. FINDINGS: No fracture or dislocation is seen. There is marked soft tissue swelling over the lateral malleolus. IMPRESSION: No recent fracture or dislocation is seen. Electronically Signed   By: 06/13/2022 M.D.   On: 04/13/2022 14:43    Limited MSK u/s left ankle: ATFL irregular and disrupted.  Peroneus longus tear with C sign noted.  Distal fibula and talar dome appear normal.  Assessment and Plan: 1) Left ankle injury - consistent with lateral sprain and age-indeterminate peroneus longus tear.  Icing, aleve or ibuprofen, ASO, home exercises.  F/u in 4 weeks.   06/13/2022 PGY-2 Family Medicine

## 2022-04-21 ENCOUNTER — Encounter: Payer: Self-pay | Admitting: Family Medicine

## 2022-05-20 ENCOUNTER — Ambulatory Visit: Payer: BC Managed Care – PPO | Admitting: Family Medicine

## 2022-05-25 ENCOUNTER — Ambulatory Visit (INDEPENDENT_AMBULATORY_CARE_PROVIDER_SITE_OTHER): Payer: BC Managed Care – PPO | Admitting: Family Medicine

## 2022-05-25 VITALS — BP 152/107 | Ht 66.0 in | Wt 250.0 lb

## 2022-05-25 DIAGNOSIS — M25572 Pain in left ankle and joints of left foot: Secondary | ICD-10-CM

## 2022-05-25 NOTE — Patient Instructions (Signed)
Wear the brace for 4 more weeks then as needed. Do home exercises as directly - try to shoot for 3-4 times a week. Call us if you aren't improving and would recommend referral for physical therapy. Otherwise follow up with me as needed.

## 2022-05-26 ENCOUNTER — Other Ambulatory Visit: Payer: Self-pay

## 2022-05-26 ENCOUNTER — Encounter: Payer: Self-pay | Admitting: Family Medicine

## 2022-05-26 DIAGNOSIS — Z3042 Encounter for surveillance of injectable contraceptive: Secondary | ICD-10-CM

## 2022-05-26 MED ORDER — MEDROXYPROGESTERONE ACETATE 150 MG/ML IM SUSP: 150.0000 mg | INTRAMUSCULAR | 0 refills | Status: DC

## 2022-05-26 NOTE — Progress Notes (Signed)
PCP: Raliegh Ip, DO  Subjective:   HPI: Patient is a 21 y.o. female here for left ankle injury.  6/12: Left Ankle Inversion Sprain:  Occurred about 2 weeks ago as she was walking to the bathroom.  Noticed immediate swelling and bruising to the lateral ankle.  She was able to hobble around after the injury but with discomfort.  She has been using a compression brace with ice which has helped with the swelling and pain.  She notes that she has a history of MS and has balance issues at baseline- typically walks with a walking stick. She reports several inversion ankle sprains, happen more commonly to her left ankle. Reassuringly, she feels that the pain and swelling are improving. She did have x-rays on 6/5 of her foot and ankle which were negative for fractures.   7/17: Patient reports she continues to slowly improve. Admits not being as diligent about home exercise program. Wearing ankle brace. Mild pain lateral ankle. No other complaints.  Past Medical History:  Diagnosis Date   Anxiety    Depression    Multiple sclerosis (HCC)     Current Outpatient Medications on File Prior to Visit  Medication Sig Dispense Refill   Alpha-Lipoic Acid 600 MG CAPS Take 1 capsule (600 mg total) by mouth daily. For neuropathy 90 capsule 1   citalopram (CELEXA) 40 MG tablet Take 1 tablet (40 mg total) by mouth daily. 90 tablet 3   Cyanocobalamin (VITAMIN B-12 PO) Take by mouth.     Diroximel Fumarate (VUMERITY) 231 MG CPDR Take 462 mg by mouth 2 (two) times daily.     gabapentin (NEURONTIN) 600 MG tablet Take 0.5 tablets (300 mg total) by mouth 2 (two) times daily. (Patient not taking: Reported on 03/10/2022) 120 tablet 1   hydrOXYzine (ATARAX/VISTARIL) 25 MG tablet Take 1-2 tablets (25-50 mg total) by mouth 3 (three) times daily as needed. 90 tablet 3   medroxyPROGESTERone (DEPO-PROVERA) 150 MG/ML injection Inject 1 mL (150 mg total) into the muscle every 3 (three) months. (NEEDS TO BE SEEN  BEFORE NEXT REFILL) 3 mL 0   omeprazole (PRILOSEC) 20 MG capsule TAKE 1 CAPSULE BY MOUTH ONCE DAILY FOR ACID REFLUX 90 capsule 3   ondansetron (ZOFRAN) 4 MG tablet Take 1 tablet (4 mg total) by mouth every 8 (eight) hours as needed for nausea or vomiting. 20 tablet 0   VITAMIN D, CHOLECALCIFEROL, PO Take by mouth.     Current Facility-Administered Medications on File Prior to Visit  Medication Dose Route Frequency Provider Last Rate Last Admin   medroxyPROGESTERone (DEPO-PROVERA) injection 150 mg  150 mg Intramuscular Q90 days Delynn Flavin M, DO   150 mg at 09/26/21 1603    History reviewed. No pertinent surgical history.  No Known Allergies  BP (!) 152/107   Ht 5\' 6"  (1.676 m)   Wt 250 lb (113.4 kg)   BMI 40.35 kg/m       No data to display              No data to display              Objective:  Physical Exam:  Gen: NAD, comfortable in exam room  Left ankle: Mild lateral swelling.  No other gross deformity, ecchymoses FROM No TTP currently 1+ ant drawer and trace talar tilt.   Negative syndesmotic compression. Thompsons test negative. NV intact distally.   Assessment & Plan:  1. Left ankle injury - 2/2 lateral sprain and  peroneus longus partial tear.  Improving slowly.  Declined formal physical therapy for now - stressed importance of home exercises and to do more diligently.  ASO for 4 more weeks.  F/u prn.

## 2022-05-26 NOTE — Telephone Encounter (Signed)
Patient needs refill of Medroxyprogesterone sent to [facility]. She is scheduling an appointment with Dr. Nadine Counts to come in before her next injection is due.

## 2022-05-30 ENCOUNTER — Other Ambulatory Visit: Payer: Self-pay | Admitting: Family Medicine

## 2022-05-30 DIAGNOSIS — Z3042 Encounter for surveillance of injectable contraceptive: Secondary | ICD-10-CM

## 2022-06-01 MED ORDER — MEDROXYPROGESTERONE ACETATE 150 MG/ML IM SUSP: 150.0000 mg | INTRAMUSCULAR | 0 refills | Status: DC

## 2022-06-02 ENCOUNTER — Ambulatory Visit: Payer: BC Managed Care – PPO

## 2022-06-04 ENCOUNTER — Ambulatory Visit (INDEPENDENT_AMBULATORY_CARE_PROVIDER_SITE_OTHER): Payer: BC Managed Care – PPO

## 2022-06-04 DIAGNOSIS — Z3042 Encounter for surveillance of injectable contraceptive: Secondary | ICD-10-CM | POA: Diagnosis not present

## 2022-06-04 NOTE — Progress Notes (Signed)
Medroxyprogesterone injection given to right upper outer quadrant.  Patient tolerated well. 

## 2022-06-17 ENCOUNTER — Encounter: Payer: Self-pay | Admitting: Family Medicine

## 2022-06-17 DIAGNOSIS — G5793 Unspecified mononeuropathy of bilateral lower limbs: Secondary | ICD-10-CM

## 2022-06-18 ENCOUNTER — Telehealth: Payer: Self-pay | Admitting: *Deleted

## 2022-06-18 ENCOUNTER — Other Ambulatory Visit: Payer: Self-pay

## 2022-06-18 DIAGNOSIS — G5793 Unspecified mononeuropathy of bilateral lower limbs: Secondary | ICD-10-CM

## 2022-06-18 MED ORDER — GABAPENTIN 600 MG PO TABS: 300.0000 mg | ORAL_TABLET | Freq: Two times a day (BID) | ORAL | 0 refills | Status: DC

## 2022-06-18 NOTE — Telephone Encounter (Signed)
Mom called in regarding Gabapentin refill from today for 600 mg 1/2 tab BID, which was changed by her Neurologist. Pt only has 1 day left and per directions pharmacy can not refill today's Rx d/t being too early. Please see MyChart message with Neurology on 03/19/22 labeled MRI [facility] which addresses new dosage Please advise and send in new script.

## 2022-06-18 NOTE — Telephone Encounter (Signed)
Dettinger,  Please review since Harlow Mares was the original prescriber. Pt did see Dr. Nadine Counts on 6/5 and does have a follow up on 9/12 for CPE with Dr. Reece Agar

## 2022-06-19 ENCOUNTER — Other Ambulatory Visit: Payer: Self-pay | Admitting: Family Medicine

## 2022-06-19 DIAGNOSIS — G5793 Unspecified mononeuropathy of bilateral lower limbs: Secondary | ICD-10-CM

## 2022-06-19 MED ORDER — GABAPENTIN 600 MG PO TABS: 600.0000 mg | ORAL_TABLET | Freq: Two times a day (BID) | ORAL | 0 refills | Status: DC

## 2022-06-19 NOTE — Telephone Encounter (Signed)
done

## 2022-06-29 ENCOUNTER — Telehealth: Payer: Self-pay | Admitting: *Deleted

## 2022-06-29 NOTE — Telephone Encounter (Signed)
[employer] PA form signed,faxed to [facility]. Received confirmation.

## 2022-06-29 NOTE — Telephone Encounter (Signed)
Received fax form [facility] re: Vumerity PA. Vumerity is preferred. Question answered, placed on MD desk for signature.

## 2022-07-08 ENCOUNTER — Other Ambulatory Visit: Payer: Self-pay | Admitting: Diagnostic Neuroimaging

## 2022-07-11 ENCOUNTER — Encounter: Payer: Self-pay | Admitting: Family Medicine

## 2022-07-11 DIAGNOSIS — F32A Depression, unspecified: Secondary | ICD-10-CM

## 2022-07-11 DIAGNOSIS — F411 Generalized anxiety disorder: Secondary | ICD-10-CM

## 2022-07-11 DIAGNOSIS — F418 Other specified anxiety disorders: Secondary | ICD-10-CM

## 2022-07-13 MED ORDER — CITALOPRAM HYDROBROMIDE 40 MG PO TABS: 40.0000 mg | ORAL_TABLET | Freq: Every day | ORAL | 0 refills | Status: DC

## 2022-07-20 ENCOUNTER — Encounter: Payer: Self-pay | Admitting: Diagnostic Neuroimaging

## 2022-07-20 ENCOUNTER — Telehealth (INDEPENDENT_AMBULATORY_CARE_PROVIDER_SITE_OTHER): Payer: BC Managed Care – PPO | Admitting: Diagnostic Neuroimaging

## 2022-07-20 DIAGNOSIS — H469 Unspecified optic neuritis: Secondary | ICD-10-CM | POA: Diagnosis not present

## 2022-07-20 DIAGNOSIS — R200 Anesthesia of skin: Secondary | ICD-10-CM | POA: Diagnosis not present

## 2022-07-20 DIAGNOSIS — R269 Unspecified abnormalities of gait and mobility: Secondary | ICD-10-CM | POA: Diagnosis not present

## 2022-07-20 DIAGNOSIS — G35 Multiple sclerosis: Secondary | ICD-10-CM | POA: Diagnosis not present

## 2022-07-20 NOTE — Progress Notes (Signed)
[facility]  PATIENT: Debra Copeland DOB: Mar 21, 2001  REFERRING CLINICIAN: Raliegh Ip, DO HISTORY FROM: patient  REASON FOR VISIT: follow up    HISTORICAL  CHIEF COMPLAINT:  No chief complaint on file.    HISTORY OF PRESENT ILLNESS:   UPDATE (07/20/22, VRP): Since last visit, doing well. Some numbness in feet, but improving. Vision better.   UPDATE (03/10/22, VRP): Since last visit, doing well until 1 month ago; then loss of appetite, numbness in abdomen, legs, feet. Also gait diff. Also right eye blurred vision. Tolerating vumerity, with some flushing.   UPDATE (10/13/21, VRP): Since last visit, doing well, now on vumerity. Vision better. Some muscle spasms in neck and hands.   UPDATE (04/09/21, VRP): Since last visit, doing a little better. Sxs worse with heat or activity. Some mild vision changes and numbness in hands. No other alleviating or aggravating factors.     PRIOR HPI: 21 year old female here for evaluation of vision changes.  Around January 17, 2021 patient noticed abnormal vision mainly from her right eye.  She felt that her central vision was decreased in her right eye when she closed her left eye, but she would see "extra brightness" in this region.  No spots or sparkles.  No dark spots or gaps in visual field.  No pain or headache.  Symptoms continued and slightly affected the left eye.  Patient went to ophthalmology on 02/28/2021 and exam was notable for decreased visual field testing in right greater than left eye, mainly inferior fields.  Patient was diagnosed with possible optic neuritis and sent to the ER for evaluation.  MRI of the brain and orbits was obtained.  Very subtle T2 hyperintensity was noted within the bilateral optic chiasm and proximal optic nerves, right greater than left side.  No abnormal enhancement noted.  She was recommended to be admitted for further testing and lumbar puncture but patient wanted to go home and  follow-up with outpatient neurology.  Patient has had some numbness and cramps in her hands.  No other prodromal accidents injuries or traumas.   REVIEW OF SYSTEMS: Full 14 system review of systems performed and negative with exception of: As per HPI.  ALLERGIES: No Known Allergies  HOME MEDICATIONS: Outpatient Medications Prior to Visit  Medication Sig Dispense Refill   Alpha-Lipoic Acid 600 MG CAPS Take 1 capsule (600 mg total) by mouth daily. For neuropathy 90 capsule 1   citalopram (CELEXA) 40 MG tablet Take 1 tablet (40 mg total) by mouth daily. 90 tablet 0   Cyanocobalamin (VITAMIN B-12 PO) Take by mouth.     gabapentin (NEURONTIN) 600 MG tablet Take 1 tablet (600 mg total) by mouth 2 (two) times daily. 180 tablet 0   hydrOXYzine (ATARAX/VISTARIL) 25 MG tablet Take 1-2 tablets (25-50 mg total) by mouth 3 (three) times daily as needed. 90 tablet 3   medroxyPROGESTERone (DEPO-PROVERA) 150 MG/ML injection Inject 1 mL (150 mg total) into the muscle every 3 (three) months. (NEEDS TO BE SEEN BEFORE NEXT REFILL) 3 mL 0   omeprazole (PRILOSEC) 20 MG capsule TAKE 1 CAPSULE BY MOUTH ONCE DAILY FOR ACID REFLUX 90 capsule 3   ondansetron (ZOFRAN) 4 MG tablet Take 1 tablet (4 mg total) by mouth every 8 (eight) hours as needed for nausea or vomiting. 20 tablet 0   VITAMIN D, CHOLECALCIFEROL, PO Take by mouth.     VUMERITY 231 MG CPDR TAKE 2 CAPSULES BY MOUTH 2 TIMES A DAY. 360 capsule 3  No facility-administered medications prior to visit.    PAST MEDICAL HISTORY: Past Medical History:  Diagnosis Date   Anxiety    Depression    Multiple sclerosis (HCC)     PAST SURGICAL HISTORY: No past surgical history on file.  FAMILY HISTORY: Family History  Problem Relation Age of Onset   Hypertension Father    Hypothyroidism Father    Vitamin D deficiency Father    Bipolar disorder Maternal Uncle    Bipolar disorder Maternal Grandfather    Depression Paternal Grandmother     SOCIAL  HISTORY: Social History   Socioeconomic History   Marital status: Single    Spouse name: Not on file   Number of children: 0   Years of education: Not on file   Highest education level: Not on file  Occupational History   Not on file  Tobacco Use   Smoking status: Never   Smokeless tobacco: Never  Vaping Use   Vaping Use: Never used  Substance and Sexual Activity   Alcohol use: Yes    Comment: occ   Drug use: Never   Sexual activity: Not Currently    Birth control/protection: Pill, Injection  Other Topics Concern   Not on file  Social History Narrative   Lives with mom dad and brother   Left Handed   Drinks 2-3 cups caffeine daily   Social Determinants of Health   Financial Resource Strain: Not on file  Food Insecurity: Not on file  Transportation Needs: Not on file  Physical Activity: Not on file  Stress: Not on file  Social Connections: Not on file  Intimate Partner Violence: Not on file     PHYSICAL EXAM  GENERAL EXAM/CONSTITUTIONAL: Vitals:  There were no vitals filed for this visit.   There is no height or weight on file to calculate BMI. Wt Readings from Last 3 Encounters:  05/25/22 250 lb (113.4 kg)  04/20/22 253 lb (114.8 kg)  04/13/22 258 lb 12.8 oz (117.4 kg)   Patient is in no distress; well developed, nourished and groomed; neck is supple  CARDIOVASCULAR: Examination of carotid arteries is normal; no carotid bruits Regular rate and rhythm, no murmurs Examination of peripheral vascular system by observation and palpation is normal  EYES: Ophthalmoscopic exam of optic discs and posterior segments is normal; no papilledema or hemorrhages No results found.  MUSCULOSKELETAL: Gait, strength, tone, movements noted in Neurologic exam below  NEUROLOGIC: MENTAL STATUS:      No data to display         awake, alert, oriented to person, place and time recent and remote memory intact normal attention and concentration language fluent,  comprehension intact, naming intact fund of knowledge appropriate  CRANIAL NERVE:  2nd - no papilledema on fundoscopic exam 2nd, 3rd, 4th, 6th - pupis equal and reactive to light, visual fields full to confrontation, extraocular muscles intact, no nystagmus 5th - facial sensation symmetric 7th - facial strength symmetric 8th - hearing intact 9th - palate elevates symmetrically, uvula midline 11th - shoulder shrug symmetric 12th - tongue protrusion midline  MOTOR:  normal bulk and tone, full strength in the BUE, BLE  SENSORY:  normal and symmetric to light touch, temperature, vibration; DECR IN FEET  COORDINATION:  finger-nose-finger, fine finger movements normal  REFLEXES:  deep tendon reflexes present and symmetric  GAIT/STATION:  UNSTEADY GAIT; SLOW STEPS     DIAGNOSTIC DATA (LABS, IMAGING, TESTING) - I reviewed patient records, labs, notes, testing and imaging myself where available.  Lab Results  Component Value Date   WBC 8.5 02/27/2022   HGB 11.9 02/27/2022   HCT 34.6 02/27/2022   MCV 100 (H) 02/27/2022   PLT 460 (H) 02/27/2022      Component Value Date/Time   NA 144 02/27/2022 1649   K 4.0 02/27/2022 1649   CL 102 02/27/2022 1649   CO2 21 02/27/2022 1649   GLUCOSE 110 (H) 02/27/2022 1649   GLUCOSE 115 (H) 02/28/2021 1805   BUN 5 (L) 02/27/2022 1649   CREATININE 0.58 02/27/2022 1649   CALCIUM 9.4 02/27/2022 1649   PROT 6.1 02/27/2022 1649   ALBUMIN 4.4 02/27/2022 1649   ALBUMIN 4.7 03/11/2021 1455   AST 33 02/27/2022 1649   ALT 23 02/27/2022 1649   ALKPHOS 106 02/27/2022 1649   BILITOT 0.7 02/27/2022 1649   GFRNONAA >60 02/28/2021 1805   GFRAA 160 11/04/2020 1449   Lab Results  Component Value Date   CHOL 168 07/10/2021   HDL 31 (L) 07/10/2021   LDLCALC 95 07/10/2021   TRIG 250 (H) 07/10/2021   CHOLHDL 5.4 (H) 07/10/2021   Lab Results  Component Value Date   HGBA1C 4.7 (L) 02/27/2022   Lab Results  Component Value Date   VITAMINB12  1,269 (H) 02/27/2022   Lab Results  Component Value Date   TSH 1.630 02/27/2022    02/28/21 MRI brain / orbits [I reviewed images myself and agree with interpretation. -VRP]  1. No acute intracranial abnormality. 2. Questionable hyperintense T2-weighted signal within the cisternal segment of the right optic nerve, equivocal for optic neuritis. 3. No evidence of intracranial demyelinating disease.  03/26/21 MRI cervical spine with and without contrast imaging: -Chronic demyelinating plaques at C3-4 anterior and centrally, and subtle at C5 on the right and subtle C6-7 on the left.   03/26/21  Normal MRI thoracic spine with without contrast.  03/11/21 VEP - The evoked response was mildly to moderately prolonged on the left, implying mild to moderate slowing along the left visual pathway.  The evoked response was absent on the right, implying severe slowing along the right visual pathways.    03/11/21 CSF STUDIES  WBC 13, RBC 0, PROT 27, GLUC 80   Component Ref Range & Units 4 wk ago 1 mo ago  CNS-IgG Synthesis Rate -9.9 - 3.3 mg/24 h 5.1 High     IgG-Index <0.66 1.39 High      OCB - The patient's CSF contains >5 well defined gamma  restriction bands that are not present in the patient's  corresponding serum sample. These bands indicate  abnormal synthesis of gammaglobulins in the central  nervous system. This finding is supportive evidence  of central nervous system inflammation, multiple  sclerosis, or infection and should be interpreted in  conjunction with all clinical and laboratory data  pertaining to this patient.   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  No abnormal enhancement in this region.  This appears stable in retrospect compared to 03/26/2021 MRI. -Spinal cord otherwise unremarkable.  No  evidence of demyelinating disease within the visualized spinal cord region spanning C5-6 down to L1-2 levels.  03/17/22 MRI brain with and without contrast demonstrating: -Minimal periventricular and subcortical foci of nonspecific T2 hyperintensities. -No acute findings.  No change from 02/28/21.    ASSESSMENT AND PLAN  21 y.o.  year old female here with abnormal vision changes in right greater than left eye, abnormal visual field testing, with subtle abnormalities on MRI orbits. Also abnl VEP, MRI cervical spine and LP results. Consistent with multiple sclerosis.   Dx:  1. Multiple sclerosis (HCC)   2. Gait difficulty   3. Numbness in feet   4. Optic neuritis      PLAN:  Demyelinating disease (multiple sclerosis) - continue vumerity - check labs (CBC) - continue gabapentin for nerve pain in feet (helping)  Return in about 6 months (around 01/18/2023).   Virtual Visit via Video Note  I connected with Debra Copeland on 07/20/22 at  2:00 PM EDT by a video enabled telemedicine application and verified that I am speaking with the correct person using two identifiers.   I discussed the limitations of evaluation and management by telemedicine and the availability of in person appointments. The patient expressed understanding and agreed to proceed.  Patient is at home and I am at the office.   I spent 15 minutes of face-to-face and non-face-to-face time with patient.  This included previsit chart review, lab review, study review, order entry, electronic health record documentation, patient education.      Suanne Marker, MD 07/20/2022, 2:01 PM Certified in Neurology, Neurophysiology and Neuroimaging  [facility] [address], [address] [place], [place] [address] [phone]

## 2022-07-21 ENCOUNTER — Ambulatory Visit (INDEPENDENT_AMBULATORY_CARE_PROVIDER_SITE_OTHER): Payer: BC Managed Care – PPO | Admitting: Family Medicine

## 2022-07-21 ENCOUNTER — Encounter: Payer: Self-pay | Admitting: Family Medicine

## 2022-07-21 VITALS — BP 142/100 | HR 99 | Temp 97.0°F | Ht 66.0 in | Wt 272.2 lb

## 2022-07-21 DIAGNOSIS — F32A Depression, unspecified: Secondary | ICD-10-CM

## 2022-07-21 DIAGNOSIS — G5793 Unspecified mononeuropathy of bilateral lower limbs: Secondary | ICD-10-CM

## 2022-07-21 DIAGNOSIS — F411 Generalized anxiety disorder: Secondary | ICD-10-CM

## 2022-07-21 DIAGNOSIS — I10 Essential (primary) hypertension: Secondary | ICD-10-CM

## 2022-07-21 DIAGNOSIS — Z0001 Encounter for general adult medical examination with abnormal findings: Secondary | ICD-10-CM

## 2022-07-21 DIAGNOSIS — G35 Multiple sclerosis: Secondary | ICD-10-CM | POA: Diagnosis not present

## 2022-07-21 DIAGNOSIS — F418 Other specified anxiety disorders: Secondary | ICD-10-CM

## 2022-07-21 DIAGNOSIS — Z1159 Encounter for screening for other viral diseases: Secondary | ICD-10-CM

## 2022-07-21 DIAGNOSIS — Z124 Encounter for screening for malignant neoplasm of cervix: Secondary | ICD-10-CM | POA: Diagnosis not present

## 2022-07-21 DIAGNOSIS — Z3042 Encounter for surveillance of injectable contraceptive: Secondary | ICD-10-CM

## 2022-07-21 DIAGNOSIS — Z0000 Encounter for general adult medical examination without abnormal findings: Secondary | ICD-10-CM

## 2022-07-21 LAB — BAYER DCA HB A1C WAIVED: HB A1C (BAYER DCA - WAIVED): 4.6 % — ABNORMAL LOW (ref 4.8–5.6)

## 2022-07-21 MED ORDER — GABAPENTIN 600 MG PO TABS: 600.0000 mg | ORAL_TABLET | Freq: Two times a day (BID) | ORAL | 3 refills | Status: DC

## 2022-07-21 MED ORDER — OMEPRAZOLE 20 MG PO CPDR: DELAYED_RELEASE_CAPSULE | ORAL | 3 refills | Status: DC

## 2022-07-21 MED ORDER — CITALOPRAM HYDROBROMIDE 40 MG PO TABS: 40.0000 mg | ORAL_TABLET | Freq: Every day | ORAL | 3 refills | Status: DC

## 2022-07-21 MED ORDER — AMLODIPINE BESYLATE 5 MG PO TABS: 5.0000 mg | ORAL_TABLET | Freq: Every day | ORAL | 3 refills | Status: DC

## 2022-07-21 MED ORDER — MEDROXYPROGESTERONE ACETATE 150 MG/ML IM SUSP: 150.0000 mg | INTRAMUSCULAR | 3 refills | Status: DC

## 2022-07-21 NOTE — Patient Instructions (Signed)
Increase vegetable intake 30 minutes of vigorous exercise daily Increase water intake and decrease sugary beverages  Preventive Care 21-21 Years Old, Female Preventive care refers to lifestyle choices and visits with your health care provider that can promote health and wellness. Preventive care visits are also called wellness exams. What can I expect for my preventive care visit? Counseling During your preventive care visit, your health care provider may ask about your: Medical history, including: Past medical problems. Family medical history. Pregnancy history. Current health, including: Menstrual cycle. Method of birth control. Emotional well-being. Home life and relationship well-being. Sexual activity and sexual health. Lifestyle, including: Alcohol, nicotine or tobacco, and drug use. Access to firearms. Diet, exercise, and sleep habits. Work and work Statistician. Sunscreen use. Safety issues such as seatbelt and bike helmet use. Physical exam Your health care provider may check your: Height and weight. These may be used to calculate your BMI (body mass index). BMI is a measurement that tells if you are at a healthy weight. Waist circumference. This measures the distance around your waistline. This measurement also tells if you are at a healthy weight and may help predict your risk of certain diseases, such as type 2 diabetes and high blood pressure. Heart rate and blood pressure. Body temperature. Skin for abnormal spots. What immunizations do I need?  Vaccines are usually given at various ages, according to a schedule. Your health care provider will recommend vaccines for you based on your age, medical history, and lifestyle or other factors, such as travel or where you work. What tests do I need? Screening Your health care provider may recommend screening tests for certain conditions. This may include: Pelvic exam and Pap test. Lipid and cholesterol levels. Diabetes  screening. This is done by checking your blood sugar (glucose) after you have not eaten for a while (fasting). Hepatitis B test. Hepatitis C test. HIV (human immunodeficiency virus) test. STI (sexually transmitted infection) testing, if you are at risk. BRCA-related cancer screening. This may be done if you have a family history of breast, ovarian, tubal, or peritoneal cancers. Talk with your health care provider about your test results, treatment options, and if necessary, the need for more tests. Follow these instructions at home: Eating and drinking  Eat a healthy diet that includes fresh fruits and vegetables, whole grains, lean protein, and low-fat dairy products. Take vitamin and mineral supplements as recommended by your health care provider. Do not drink alcohol if: Your health care provider tells you not to drink. You are pregnant, may be pregnant, or are planning to become pregnant. If you drink alcohol: Limit how much you have to 0-1 drink a day. Know how much alcohol is in your drink. In the [place] one drink equals one 12 oz bottle of beer (355 mL), one 5 oz glass of wine (148 mL), or one 1 oz glass of hard liquor (44 mL). Lifestyle Brush your teeth every morning and night with fluoride toothpaste. Floss one time each day. Exercise for at least 30 minutes 5 or more days each week. Do not use any products that contain nicotine or tobacco. These products include cigarettes, chewing tobacco, and vaping devices, such as e-cigarettes. If you need help quitting, ask your health care provider. Do not use drugs. If you are sexually active, practice safe sex. Use a condom or other form of protection to prevent STIs. If you do not wish to become pregnant, use a form of birth control. If you plan to become pregnant, see  your health care provider for a prepregnancy visit. Find healthy ways to manage stress, such as: Meditation, yoga, or listening to music. Journaling. Talking to a trusted  person. Spending time with friends and family. Minimize exposure to UV radiation to reduce your risk of skin cancer. Safety Always wear your seat belt while driving or riding in a vehicle. Do not drive: If you have been drinking alcohol. Do not ride with someone who has been drinking. If you have been using any mind-altering substances or drugs. While texting. When you are tired or distracted. Wear a helmet and other protective equipment during sports activities. If you have firearms in your house, make sure you follow all gun safety procedures. Seek help if you have been physically or sexually abused. What's next? Go to your health care provider once a year for an annual wellness visit. Ask your health care provider how often you should have your eyes and teeth checked. Stay up to date on all vaccines. This information is not intended to replace advice given to you by your health care provider. Make sure you discuss any questions you have with your health care provider. Document Revised: 04/23/2021 Document Reviewed: 04/23/2021 Elsevier Patient Education  [place].

## 2022-07-21 NOTE — Progress Notes (Signed)
Debra Copeland is a 21 y.o. female presents to office today for annual physical exam examination.    Concerns today include: 1.  None she is doing well.  MS has been stable and she continues to take gabapentin 600 mg twice daily.  Last visit with neurology was virtually yesterday and she will see him again in 6 months  Occupation: Unemployed, Marital status: Single, never sexually active, Substance use: None Diet: Unrestricted, high in sugar and calories, Exercise: None Last eye exam: Up-to-date Last pap smear: Never sexually active Refills needed today: None Immunizations needed: Immunization History  Administered Date(s) Administered   DTaP 02/24/2001, 04/25/2001, 06/28/2001, 04/12/2002, 12/29/2005   HIB (PRP-OMP) 02/24/2001, 04/25/2001, 06/28/2001, 04/12/2002   HIB, Unspecified 02/24/2001, 04/25/2001, 06/28/2001, 04/12/2002   HPV Quadrivalent 07/31/2015, 10/01/2015, 01/29/2016   Hepatitis A, Ped/Adol-2 Dose 07/31/2015, 01/29/2016   Hepatitis B, PED/ADOLESCENT 01-25-2001, 02/03/2001, 06/28/2001   IPV 02/24/2001, 04/25/2001, 06/28/2001, 12/29/2005   MMR 12/29/2001, 12/29/2005   Meningococcal Conjugate 07/31/2015, 12/23/2016   Pneumococcal Conjugate-13 02/24/2001, 04/25/2001, 06/28/2001, 04/12/2002   Tdap 07/31/2015   Varicella 12/29/2001, 07/31/2015     Past Medical History:  Diagnosis Date   Anxiety    Depression    Multiple sclerosis ([place])    Social History   Socioeconomic History   Marital status: Single    Spouse name: Not on file   Number of children: 0   Years of education: Not on file   Highest education level: Not on file  Occupational History   Not on file  Tobacco Use   Smoking status: Never   Smokeless tobacco: Never  Vaping Use   Vaping Use: Never used  Substance and Sexual Activity   Alcohol use: Yes    Comment: occ   Drug use: Never   Sexual activity: Not Currently    Birth control/protection: Pill, Injection  Other Topics Concern   Not  on file  Social History Narrative   Lives with mom dad and brother   Left Handed   Drinks 2-3 cups caffeine daily   Social Determinants of Health   Financial Resource Strain: Not on file  Food Insecurity: Not on file  Transportation Needs: Not on file  Physical Activity: Not on file  Stress: Not on file  Social Connections: Not on file  Intimate Partner Violence: Not on file   No past surgical history on file. Family History  Problem Relation Age of Onset   Hypertension Father    Hypothyroidism Father    Vitamin D deficiency Father    Bipolar disorder Maternal Uncle    Bipolar disorder Maternal Grandfather    Depression Paternal Grandmother     Current Outpatient Medications:    Alpha-Lipoic Acid 600 MG CAPS, Take 1 capsule (600 mg total) by mouth daily. For neuropathy, Disp: 90 capsule, Rfl: 1   citalopram (CELEXA) 40 MG tablet, Take 1 tablet (40 mg total) by mouth daily., Disp: 90 tablet, Rfl: 0   Cyanocobalamin (VITAMIN B-12 PO), Take by mouth., Disp: , Rfl:    gabapentin (NEURONTIN) 600 MG tablet, Take 1 tablet (600 mg total) by mouth 2 (two) times daily., Disp: 180 tablet, Rfl: 0   hydrOXYzine (ATARAX/VISTARIL) 25 MG tablet, Take 1-2 tablets (25-50 mg total) by mouth 3 (three) times daily as needed., Disp: 90 tablet, Rfl: 3   medroxyPROGESTERone (DEPO-PROVERA) 150 MG/ML injection, Inject 1 mL (150 mg total) into the muscle every 3 (three) months. (NEEDS TO BE SEEN BEFORE NEXT REFILL), Disp: 3 mL, Rfl: 0  omeprazole (PRILOSEC) 20 MG capsule, TAKE 1 CAPSULE BY MOUTH ONCE DAILY FOR ACID REFLUX, Disp: 90 capsule, Rfl: 3   ondansetron (ZOFRAN) 4 MG tablet, Take 1 tablet (4 mg total) by mouth every 8 (eight) hours as needed for nausea or vomiting., Disp: 20 tablet, Rfl: 0   VITAMIN D, CHOLECALCIFEROL, PO, Take by mouth., Disp: , Rfl:    VUMERITY 231 MG CPDR, TAKE 2 CAPSULES BY MOUTH 2 TIMES A DAY., Disp: 360 capsule, Rfl: 3  No Known Allergies   ROS: Review of Systems A  comprehensive review of systems was negative except for: Neurological: positive for peripheral neuropathy    Physical exam BP (!) 142/100   Pulse 99   Temp (!) 97 F (36.1 C) (Temporal)   Ht _0  (1.676 m)   Wt 272 lb 3.2 oz (123.5 kg)   SpO2 95%   BMI 43.93 kg/m  General appearance: alert, cooperative, appears stated age, no distress, and morbidly obese Head: Normocephalic, without obvious abnormality, atraumatic Eyes: negative findings: lids and lashes normal, conjunctivae and sclerae normal, corneas clear, and pupils equal, round, reactive to light and accomodation Ears: normal TM's and external ear canals both ears Nose: Nares normal. Septum midline. Mucosa normal. No drainage or sinus tenderness. Throat: lips, mucosa, and tongue normal; teeth and gums normal Neck: no adenopathy, supple, symmetrical, trachea midline, and thyroid not enlarged, symmetric, no tenderness/mass/nodules Back: symmetric, no curvature. ROM normal. No CVA tenderness. Lungs: clear to auscultation bilaterally Heart: regular rate and rhythm, S1, S2 normal, no murmur, click, rub or gallop Abdomen: soft, non-tender; bowel sounds normal; no masses,  no organomegaly and obese Extremities: extremities normal, atraumatic, no cyanosis or edema Pulses: 2+ and symmetric Skin:  multiple insect bites along the lower legs. Lymph nodes: Cervical, supraclavicular, and axillary nodes normal. Neurologic: gait normal, speech normal,PERRL, EOMI Psych: mood [place], speech normal     07/21/2022    3:38 PM 04/13/2022    2:26 PM 03/05/2022    3:22 PM  Depression screen PHQ 2/9  Decreased Interest 0 0 0  Down, Depressed, Hopeless 0 0 0  PHQ - 2 Score 0 0 0  Altered sleeping _1 Tired, decreased energy _2 Change in appetite 0 0 3  Feeling bad or failure about yourself  0 0 0  Trouble concentrating 0 0 0  Moving slowly or fidgety/restless 0 0 2  Suicidal thoughts 0 0 0  PHQ-9 Score _3 Difficult doing  work/chores Not difficult at all Somewhat difficult Somewhat difficult      07/21/2022    3:38 PM 04/13/2022    2:26 PM 03/05/2022    3:22 PM 02/27/2022    4:20 PM  GAD 7 : Generalized Anxiety Score  Nervous, Anxious, on Edge 0 0 0 1  Control/stop worrying 0 0 0 0  Worry too much - different things 0 0 0 0  Trouble relaxing 0 _4 Restless 0 0 0 1  Easily annoyed or irritable 0 0 0 0  Afraid - awful might happen 0 0 0 0  Total GAD 7 Score 0 _5 Anxiety Difficulty Not difficult at all Not difficult at all Not difficult at all Not difficult at all    Assessment/ Plan: Chaney Malling here for annual physical exam.   Annual physical exam  Screening for malignant neoplasm of cervix  Multiple sclerosis ([place]) - Plan: CMP14+EGFR, CBC, CBC, CMP14+EGFR  Neuropathic pain  of both feet - Plan: gabapentin (NEURONTIN) 600 MG tablet  Morbid obesity ([place]) - Plan: Lipid panel, CMP14+EGFR, TSH, Bayer DCA Hb A1c Waived, Bayer DCA Hb A1c Waived, TSH, CMP14+EGFR, Lipid panel  Essential hypertension - Plan: amLODipine (NORVASC) 5 MG tablet  Generalized anxiety disorder - Plan: CMP14+EGFR, TSH, citalopram (CELEXA) 40 MG tablet, TSH, CMP14+EGFR  Depressive disorder - Plan: CMP14+EGFR, TSH, citalopram (CELEXA) 40 MG tablet, TSH, CMP14+EGFR  Anxiety about health - Plan: citalopram (CELEXA) 40 MG tablet  Encounter for hepatitis C screening test for low risk patient - Plan: Hepatitis C antibody, Hepatitis C antibody  Encounter for surveillance of injectable contraceptive - Plan: medroxyPROGESTERone (DEPO-PROVERA) 150 MG/ML injection  Counseled on balanced diet and increased physical exercise.  Increase veggies, fruit, water  MS stable, pain controlled  Anxiety and depression stable. No changes  BP not controlled. Norvasc 5 mg started.  Return in 2 weeks for BP check with nurse.  Counseled on healthy lifestyle choices, including diet (rich in fruits, vegetables and lean meats and low in  salt and simple carbohydrates) and exercise (at least 30 minutes of moderate physical activity daily).  Patient to follow up in 1 year for annual exam or sooner if needed.  Dujuan Stankowski M. Lajuana Ripple, DO

## 2022-07-22 LAB — CBC
Hematocrit: 42.5 % (ref 34.0–46.6)
Hemoglobin: 14.4 g/dL (ref 11.1–15.9)
MCH: 33 pg (ref 26.6–33.0)
MCHC: 33.9 g/dL (ref 31.5–35.7)
MCV: 98 fL — ABNORMAL HIGH (ref 79–97)
Platelets: 360 10*3/uL (ref 150–450)
RBC: 4.36 x10E6/uL (ref 3.77–5.28)
RDW: 14.4 % (ref 11.7–15.4)
WBC: 8.8 10*3/uL (ref 3.4–10.8)

## 2022-07-22 LAB — CMP14+EGFR
ALT: 34 IU/L — ABNORMAL HIGH (ref 0–32)
AST: 41 IU/L — ABNORMAL HIGH (ref 0–40)
Albumin/Globulin Ratio: 2.2 (ref 1.2–2.2)
Albumin: 4.3 g/dL (ref 4.0–5.0)
Alkaline Phosphatase: 111 IU/L (ref 44–121)
BUN/Creatinine Ratio: 7 — ABNORMAL LOW (ref 9–23)
BUN: 3 mg/dL — ABNORMAL LOW (ref 6–20)
Bilirubin Total: 0.4 mg/dL (ref 0.0–1.2)
CO2: 20 mmol/L (ref 20–29)
Calcium: 9.2 mg/dL (ref 8.7–10.2)
Chloride: 103 mmol/L (ref 96–106)
Creatinine, Ser: 0.43 mg/dL — ABNORMAL LOW (ref 0.57–1.00)
Globulin, Total: 2 g/dL (ref 1.5–4.5)
Glucose: 95 mg/dL (ref 70–99)
Potassium: 4.4 mmol/L (ref 3.5–5.2)
Sodium: 139 mmol/L (ref 134–144)
Total Protein: 6.3 g/dL (ref 6.0–8.5)
eGFR: 142 mL/min/{1.73_m2} (ref >59)

## 2022-07-22 LAB — LIPID PANEL
Chol/HDL Ratio: 3.8 ratio (ref 0.0–4.4)
Cholesterol, Total: 148 mg/dL (ref 100–199)
HDL: 39 mg/dL — ABNORMAL LOW (ref >39)
LDL Chol Calc (NIH): 80 mg/dL (ref 0–99)
Triglycerides: 168 mg/dL — ABNORMAL HIGH (ref 0–149)
VLDL Cholesterol Cal: 29 mg/dL (ref 5–40)

## 2022-07-22 LAB — HEPATITIS C ANTIBODY: Hep C Virus Ab: NONREACTIVE

## 2022-07-22 LAB — TSH: TSH: 1.98 u[IU]/mL (ref 0.450–4.500)

## 2022-08-04 ENCOUNTER — Ambulatory Visit: Payer: BC Managed Care – PPO

## 2022-08-10 ENCOUNTER — Ambulatory Visit: Payer: BC Managed Care – PPO

## 2022-08-10 DIAGNOSIS — Z0130 Encounter for examination of blood pressure without abnormal findings: Secondary | ICD-10-CM

## 2022-08-10 NOTE — Progress Notes (Signed)
Patient came in for a BP check.  BP-150/94 P-100  Patient waited a few mins and BP was re taken.  B/P-146/97 P-103  Will send to PCP to advise.

## 2022-08-10 NOTE — Progress Notes (Signed)
Please make sure she has a 55m f/u for BP with me and please make sure her Norvasc is taken at least 2 hours before that appt.

## 2022-08-10 NOTE — Progress Notes (Signed)
Patient aware and verbalizes understanding.  Appointment scheduled. 

## 2022-08-24 ENCOUNTER — Ambulatory Visit (INDEPENDENT_AMBULATORY_CARE_PROVIDER_SITE_OTHER): Payer: BC Managed Care – PPO

## 2022-08-24 DIAGNOSIS — Z3042 Encounter for surveillance of injectable contraceptive: Secondary | ICD-10-CM | POA: Diagnosis not present

## 2022-08-24 MED ORDER — MEDROXYPROGESTERONE ACETATE 150 MG/ML IM SUSP
150.0000 mg | INTRAMUSCULAR | Status: DC
  Administered 2022-08-24 – 2024-01-11 (×7): 150 mg via INTRAMUSCULAR

## 2022-08-24 NOTE — Progress Notes (Signed)
Medroxyprogesterone injection given to left upper outer quadrant.  Patient tolerated well. 

## 2022-09-11 ENCOUNTER — Ambulatory Visit: Payer: BC Managed Care – PPO | Admitting: Family Medicine

## 2022-09-11 ENCOUNTER — Encounter: Payer: Self-pay | Admitting: Family Medicine

## 2022-09-11 VITALS — BP 137/72 | HR 114 | Temp 98.3°F | Ht 66.0 in | Wt 273.4 lb

## 2022-09-11 DIAGNOSIS — I10 Essential (primary) hypertension: Secondary | ICD-10-CM | POA: Diagnosis not present

## 2022-09-11 DIAGNOSIS — G5793 Unspecified mononeuropathy of bilateral lower limbs: Secondary | ICD-10-CM | POA: Diagnosis not present

## 2022-09-11 DIAGNOSIS — F411 Generalized anxiety disorder: Secondary | ICD-10-CM | POA: Diagnosis not present

## 2022-09-11 MED ORDER — GABAPENTIN 600 MG PO TABS: 600.0000 mg | ORAL_TABLET | Freq: Three times a day (TID) | ORAL | 3 refills | Status: DC

## 2022-09-11 NOTE — Patient Instructions (Signed)
Sleep Apnea Sleep apnea is a condition in which breathing pauses or becomes shallow during sleep. People with sleep apnea usually snore loudly. They may have times when they gasp and stop breathing for 10 seconds or more during sleep. This may happen many times during the night. Sleep apnea disrupts your sleep and keeps your body from getting the rest that it needs. This condition can increase your risk of certain health problems, including: Heart attack. Stroke. Obesity. Type 2 diabetes. Heart failure. Irregular heartbeat. High blood pressure. The goal of treatment is to help you breathe normally again. What are the causes?  The most common cause of sleep apnea is a collapsed or blocked airway. There are three kinds of sleep apnea: Obstructive sleep apnea. This kind is caused by a blocked or collapsed airway. Central sleep apnea. This kind happens when the part of the brain that controls breathing does not send the correct signals to the muscles that control breathing. Mixed sleep apnea. This is a combination of obstructive and central sleep apnea. What increases the risk? You are more likely to develop this condition if you: Are overweight. Smoke. Have a smaller than normal airway. Are older. Are female. Drink alcohol. Take sedatives or tranquilizers. Have a family history of sleep apnea. Have a tongue or tonsils that are larger than normal. What are the signs or symptoms? Symptoms of this condition include: Trouble staying asleep. Loud snoring. Morning headaches. Waking up gasping. Dry mouth or sore throat in the morning. Daytime sleepiness and tiredness. If you have daytime fatigue because of sleep apnea, you may be more likely to have: Trouble concentrating. Forgetfulness. Irritability or mood swings. Personality changes. Feelings of depression. Sexual dysfunction. This may include loss of interest if you are female, or erectile dysfunction if you are female. How is this  diagnosed? This condition may be diagnosed with: A medical history. A physical exam. A series of tests that are done while you are sleeping (sleep study). These tests are usually done in a sleep lab, but they may also be done at home. How is this treated? Treatment for this condition aims to restore normal breathing and to ease symptoms during sleep. It may involve managing health issues that can affect breathing, such as high blood pressure or obesity. Treatment may include: Sleeping on your side. Using a decongestant if you have nasal congestion. Avoiding the use of depressants, including alcohol, sedatives, and narcotics. Losing weight if you are overweight. Making changes to your diet. Quitting smoking. Using a device to open your airway while you sleep, such as: An oral appliance. This is a custom-made mouthpiece that shifts your lower jaw forward. A continuous positive airway pressure (CPAP) device. This device blows air through a mask when you breathe out (exhale). A nasal expiratory positive airway pressure (EPAP) device. This device has valves that you put into each nostril. A bi-level positive airway pressure (BIPAP) device. This device blows air through a mask when you breathe in (inhale) and breathe out (exhale). Having surgery if other treatments do not work. During surgery, excess tissue is removed to create a wider airway. Follow these instructions at home: Lifestyle Make any lifestyle changes that your health care provider recommends. Eat a healthy, well-balanced diet. Take steps to lose weight if you are overweight. Avoid using depressants, including alcohol, sedatives, and narcotics. Do not use any products that contain nicotine or tobacco. These products include cigarettes, chewing tobacco, and vaping devices, such as e-cigarettes. If you need help quitting, ask   your health care provider. General instructions Take over-the-counter and prescription medicines only as told  by your health care provider. If you were given a device to open your airway while you sleep, use it only as told by your health care provider. If you are having surgery, make sure to tell your health care provider you have sleep apnea. You may need to bring your device with you. Keep all follow-up visits. This is important. Contact a health care provider if: The device that you received to open your airway during sleep is uncomfortable or does not seem to be working. Your symptoms do not improve. Your symptoms get worse. Get help right away if: You develop: Chest pain. Shortness of breath. Discomfort in your back, arms, or stomach. You have: Trouble speaking. Weakness on one side of your body. Drooping in your face. These symptoms may represent a serious problem that is an emergency. Do not wait to see if the symptoms will go away. Get medical help right away. Call your local emergency services (911 in the [place] Do not drive yourself to the hospital. Summary Sleep apnea is a condition in which breathing pauses or becomes shallow during sleep. The most common cause is a collapsed or blocked airway. The goal of treatment is to restore normal breathing and to ease symptoms during sleep. This information is not intended to replace advice given to you by your health care provider. Make sure you discuss any questions you have with your health care provider. Document Revised: 06/04/2021 Document Reviewed: 10/04/2020 Elsevier Patient Education  2023 Elsevier Inc.  

## 2022-09-11 NOTE — Progress Notes (Signed)
Subjective: CC: Follow-up hypertension PCP: Janora Norlander, DO [id number] B Detter is a 21 y.o. female presenting to clinic today for:  1.  Hypertension associate with morbid obesity Patient is compliant with Norvasc 5 mg daily.  No reported chest pain, shortness of breath or swelling.  2.  Multiple sclerosis/ GAD Patient continues to be treated with medications as outlined by neurology.  She reports 600 mg twice daily of the gabapentin really has helped her sharp pains that she was experiencing in her feet quite a bit.  Denies any drowsiness from this medication.  Though she does sleep during the day but reports that sometimes this is because she is bored.  She reports some anxiety symptoms that seem to peak around 6 AM but get better after she takes her gabapentin.  She takes this around 6 AM and 5 PM every day.   ROS: Per HPI  No Known Allergies Past Medical History:  Diagnosis Date   Anxiety    Depression    Multiple sclerosis ([place])     Current Outpatient Medications:    Alpha-Lipoic Acid 600 MG CAPS, Take 1 capsule (600 mg total) by mouth daily. For neuropathy, Disp: 90 capsule, Rfl: 1   amLODipine (NORVASC) 5 MG tablet, Take 1 tablet (5 mg total) by mouth daily. For blood pressure, Disp: 90 tablet, Rfl: 3   citalopram (CELEXA) 40 MG tablet, Take 1 tablet (40 mg total) by mouth daily., Disp: 90 tablet, Rfl: 3   Cyanocobalamin (VITAMIN B-12 PO), Take by mouth., Disp: , Rfl:    gabapentin (NEURONTIN) 600 MG tablet, Take 1 tablet (600 mg total) by mouth 2 (two) times daily., Disp: 180 tablet, Rfl: 3   hydrOXYzine (ATARAX/VISTARIL) 25 MG tablet, Take 1-2 tablets (25-50 mg total) by mouth 3 (three) times daily as needed., Disp: 90 tablet, Rfl: 3   medroxyPROGESTERone (DEPO-PROVERA) 150 MG/ML injection, Inject 1 mL (150 mg total) into the muscle every 3 (three) months., Disp: 1 mL, Rfl: 3   omeprazole (PRILOSEC) 20 MG capsule, TAKE 1 CAPSULE BY MOUTH ONCE DAILY FOR ACID  REFLUX, Disp: 90 capsule, Rfl: 3   ondansetron (ZOFRAN) 4 MG tablet, Take 1 tablet (4 mg total) by mouth every 8 (eight) hours as needed for nausea or vomiting., Disp: 20 tablet, Rfl: 0   VITAMIN D, CHOLECALCIFEROL, PO, Take by mouth., Disp: , Rfl:    VUMERITY 231 MG CPDR, TAKE 2 CAPSULES BY MOUTH 2 TIMES A DAY., Disp: 360 capsule, Rfl: 3  Current Facility-Administered Medications:    medroxyPROGESTERone (DEPO-PROVERA) injection 150 mg, 150 mg, Intramuscular, Q90 days, Cristhian Vanhook M, DO, 150 mg at 08/24/22 1611 Social History   Socioeconomic History   Marital status: Single    Spouse name: Not on file   Number of children: 0   Years of education: Not on file   Highest education level: Not on file  Occupational History   Not on file  Tobacco Use   Smoking status: Never   Smokeless tobacco: Never  Vaping Use   Vaping Use: Never used  Substance and Sexual Activity   Alcohol use: Yes    Comment: occ   Drug use: Never   Sexual activity: Not Currently    Birth control/protection: Pill, Injection  Other Topics Concern   Not on file  Social History Narrative   Lives with mom dad and brother   Left Handed   Drinks 2-3 cups caffeine daily   Social Determinants of Radio broadcast assistant  Strain: Not on file  Food Insecurity: Not on file  Transportation Needs: Not on file  Physical Activity: Not on file  Stress: Not on file  Social Connections: Not on file  Intimate Partner Violence: Not on file   Family History  Problem Relation Age of Onset   Hypertension Father    Hypothyroidism Father    Vitamin D deficiency Father    Bipolar disorder Maternal Uncle    Bipolar disorder Maternal Grandfather    Depression Paternal Grandmother     Objective: Office vital signs reviewed. BP 137/72   Pulse (!) 114   Temp 98.3 F (36.8 C)   Ht 5\' 6"  (1.676 m)   Wt 273 lb 6.4 oz (124 kg)   SpO2 97%   BMI 44.13 kg/m   Physical Examination:  General: Awake, alert, morbidly  obese, No acute distress HEENT: sclera white, MMM Cardio: regular rate and rhythm, S1S2 heard, no murmurs appreciated Pulm: clear to auscultation bilaterally, no wheezes, rhonchi or rales; normal work of breathing on room air MSk: Ambulating independently Psych: Very pleasant, interactive     09/11/2022    4:26 PM 07/21/2022    3:38 PM 04/13/2022    2:26 PM  Depression screen PHQ 2/9  Decreased Interest 0 0 0  Down, Depressed, Hopeless 0 0 0  PHQ - 2 Score 0 0 0  Altered sleeping 0 2 1  Tired, decreased energy 1 1 1   Change in appetite 0 0 0  Feeling bad or failure about yourself  0 0 0  Trouble concentrating 0 0 0  Moving slowly or fidgety/restless 0 0 0  Suicidal thoughts 0 0 0  PHQ-9 Score 1 3 2   Difficult doing work/chores Not difficult at all Not difficult at all Somewhat difficult      09/11/2022    4:26 PM 07/21/2022    3:38 PM 04/13/2022    2:26 PM 03/05/2022    3:22 PM  GAD 7 : Generalized Anxiety Score  Nervous, Anxious, on Edge 1 0 0 0  Control/stop worrying 0 0 0 0  Worry too much - different things 0 0 0 0  Trouble relaxing 0 0 1 1  Restless 1 0 0 0  Easily annoyed or irritable 0 0 0 0  Afraid - awful might happen 0 0 0 0  Total GAD 7 Score 2 0 1 1  Anxiety Difficulty Somewhat difficult Not difficult at all Not difficult at all Not difficult at all      Assessment/ Plan: 21 y.o. female   Neuropathic pain of both feet - Plan: gabapentin (NEURONTIN) 600 MG tablet  Morbid obesity ([facility]) - Plan: Hepatic function panel  Essential hypertension  Generalized anxiety disorder - Plan: gabapentin (NEURONTIN) 600 MG tablet  Neuropathy moderately well controlled with Gabapentin 600mg  BID.  Will advance to TID dosing for mood disorder.    Would like to get repeat LFTs given mild elevation noted on September labs.  I am going to CC her neurologist as FYI on dose adjustment but I wonder if she might benefit from a sleep study given body habitus, mood issues and  hypertension  She will continue Norvasc 5 mg daily as her repeat blood pressure was within acceptable range.  No orders of the defined types were placed in this encounter.  No orders of the defined types were placed in this encounter.    03/07/2022, DO [facility] [phone]

## 2022-11-11 ENCOUNTER — Ambulatory Visit (INDEPENDENT_AMBULATORY_CARE_PROVIDER_SITE_OTHER): Payer: BC Managed Care – PPO | Admitting: *Deleted

## 2022-11-11 DIAGNOSIS — Z3042 Encounter for surveillance of injectable contraceptive: Secondary | ICD-10-CM

## 2022-11-11 NOTE — Progress Notes (Signed)
Depo provera injection given, right upper outer quadrant, intramuscular. Patient tolerated well.

## 2023-01-21 IMAGING — XA DG SPINAL PUNCT LUMBAR DIAG WITH FL CT GUIDANCE
1 series · 1 of 1 positions shown · non-contrast
Comparison: None.

CLINICAL DATA: Possible optic neuritis.

EXAM:
DIAGNOSTIC LUMBAR PUNCTURE UNDER FLUOROSCOPIC GUIDANCE

[Series 1: ortho adipose · 1 of 1 slices shown]
[im 1/1]
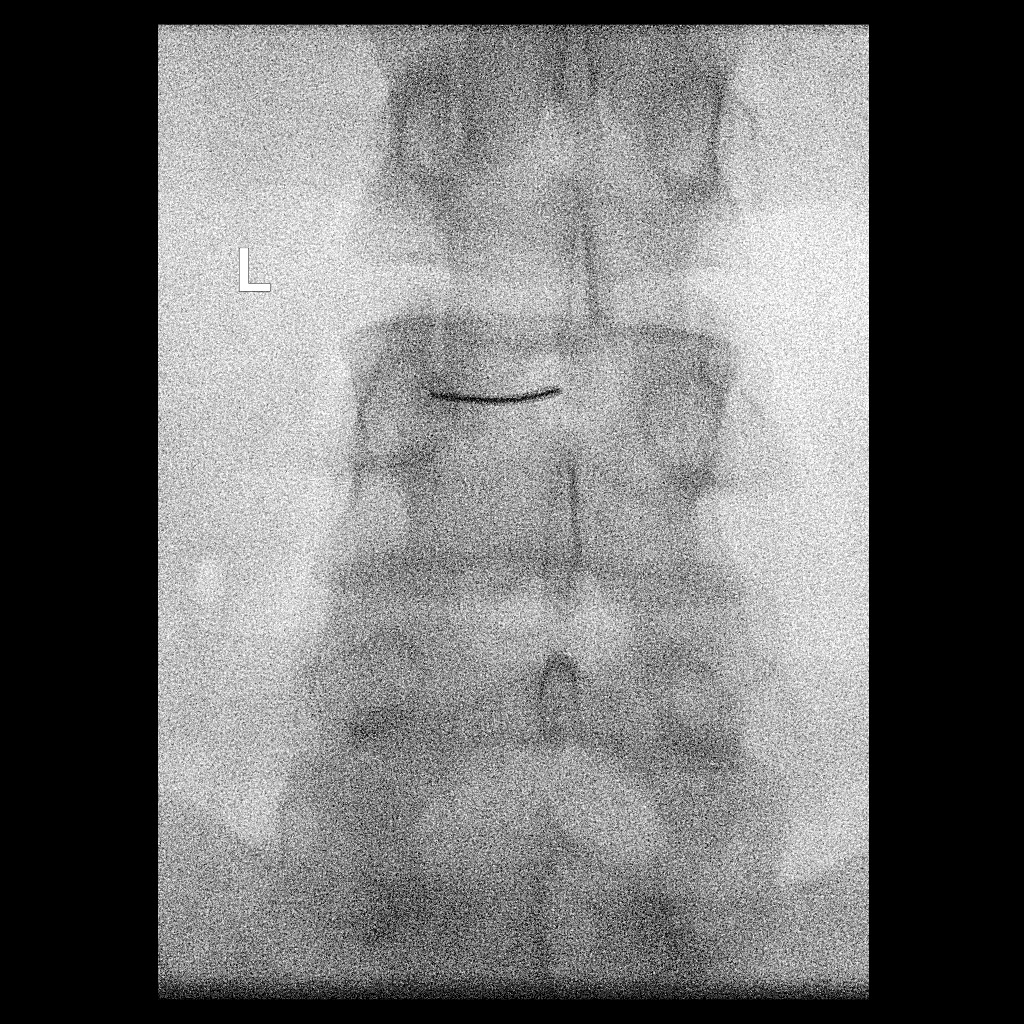

[1 of 1 positions shown; findings below may reference images not displayed]

FLUOROSCOPY TIME:  Fluoroscopy Time:  11 seconds

Radiation Exposure Index (if provided by the fluoroscopic device):
1.8 mGy

Number of Acquired Spot Images: 0

PROCEDURE:
Informed consent was obtained from the patient prior to the
procedure, including potential complications of headache, allergy,
and pain. With the patient prone, the lower back was prepped with
Betadine. 1% Lidocaine was used for local anesthesia. Lumbar
puncture was performed at the L3-L4 level via a left interlaminar
approach using a 6 inch 20 gauge needle with return of clear,
colorless CSF with an opening pressure of 18 cm water, measured in
the left lateral decubitus position. 8 ml of CSF were obtained for
laboratory studies. The patient tolerated the procedure well and
there were no apparent complications.
IMPRESSION: Technically successful fluoroscopically guided lumbar puncture.

## 2023-02-01 ENCOUNTER — Ambulatory Visit (INDEPENDENT_AMBULATORY_CARE_PROVIDER_SITE_OTHER): Payer: BC Managed Care – PPO | Admitting: *Deleted

## 2023-02-01 DIAGNOSIS — Z3042 Encounter for surveillance of injectable contraceptive: Secondary | ICD-10-CM | POA: Diagnosis not present

## 2023-02-01 NOTE — Progress Notes (Signed)
Depo provera injection, left upper outer quadrant, intramuscular, patient tolerated well.

## 2023-04-29 ENCOUNTER — Ambulatory Visit: Payer: BC Managed Care – PPO

## 2023-05-06 ENCOUNTER — Ambulatory Visit: Payer: BC Managed Care – PPO

## 2023-05-06 NOTE — Progress Notes (Unsigned)
Erroneous encounter

## 2023-05-07 ENCOUNTER — Ambulatory Visit: Payer: BC Managed Care – PPO

## 2023-05-10 ENCOUNTER — Ambulatory Visit (INDEPENDENT_AMBULATORY_CARE_PROVIDER_SITE_OTHER): Payer: BC Managed Care – PPO

## 2023-05-10 DIAGNOSIS — Z3042 Encounter for surveillance of injectable contraceptive: Secondary | ICD-10-CM

## 2023-05-10 LAB — PREGNANCY, URINE: Preg Test, Ur: NEGATIVE

## 2023-05-10 NOTE — Progress Notes (Signed)
Pregnancy was obtained because patient was past due for injections, results were negative.  Medroxyprogesterone injection given to right upper outer quadrant.  Patient tolerated well.

## 2023-05-31 ENCOUNTER — Telehealth: Payer: Self-pay | Admitting: Neurology

## 2023-05-31 NOTE — Telephone Encounter (Signed)
PA completed via paper fax from [facility] state health plan for vumerity. Notes and PA submitted to Jocelyn Lamer [phone] Received confirmation of fax

## 2023-06-12 ENCOUNTER — Other Ambulatory Visit: Payer: Self-pay | Admitting: Family Medicine

## 2023-06-12 DIAGNOSIS — Z3042 Encounter for surveillance of injectable contraceptive: Secondary | ICD-10-CM

## 2023-06-23 ENCOUNTER — Other Ambulatory Visit: Payer: Self-pay | Admitting: Diagnostic Neuroimaging

## 2023-07-10 ENCOUNTER — Other Ambulatory Visit: Payer: Self-pay | Admitting: Family Medicine

## 2023-07-10 DIAGNOSIS — I10 Essential (primary) hypertension: Secondary | ICD-10-CM

## 2023-07-14 ENCOUNTER — Encounter: Payer: Self-pay | Admitting: Family Medicine

## 2023-07-14 DIAGNOSIS — F32A Depression, unspecified: Secondary | ICD-10-CM

## 2023-07-14 DIAGNOSIS — R4589 Other symptoms and signs involving emotional state: Secondary | ICD-10-CM

## 2023-07-14 DIAGNOSIS — F411 Generalized anxiety disorder: Secondary | ICD-10-CM

## 2023-07-19 MED ORDER — CITALOPRAM HYDROBROMIDE 40 MG PO TABS: 40.0000 mg | ORAL_TABLET | Freq: Every day | ORAL | 3 refills | Status: DC

## 2023-07-28 ENCOUNTER — Encounter: Payer: Self-pay | Admitting: Family Medicine

## 2023-07-30 ENCOUNTER — Ambulatory Visit (INDEPENDENT_AMBULATORY_CARE_PROVIDER_SITE_OTHER): Payer: BC Managed Care – PPO | Admitting: *Deleted

## 2023-07-30 DIAGNOSIS — Z3042 Encounter for surveillance of injectable contraceptive: Secondary | ICD-10-CM

## 2023-07-30 NOTE — Progress Notes (Signed)
Pt given Depo Provera 150mg  IM left glut and tolerated well.

## 2023-08-05 ENCOUNTER — Encounter (INDEPENDENT_AMBULATORY_CARE_PROVIDER_SITE_OTHER): Payer: Self-pay | Admitting: Family Medicine

## 2023-08-05 DIAGNOSIS — R600 Localized edema: Secondary | ICD-10-CM | POA: Diagnosis not present

## 2023-08-06 NOTE — Telephone Encounter (Signed)

## 2023-09-10 ENCOUNTER — Other Ambulatory Visit: Payer: Self-pay | Admitting: Family Medicine

## 2023-09-10 DIAGNOSIS — Z3042 Encounter for surveillance of injectable contraceptive: Secondary | ICD-10-CM

## 2023-09-25 ENCOUNTER — Other Ambulatory Visit: Payer: Self-pay | Admitting: Family Medicine

## 2023-09-25 DIAGNOSIS — F32A Depression, unspecified: Secondary | ICD-10-CM

## 2023-09-25 DIAGNOSIS — R4589 Other symptoms and signs involving emotional state: Secondary | ICD-10-CM

## 2023-09-25 DIAGNOSIS — F411 Generalized anxiety disorder: Secondary | ICD-10-CM

## 2023-09-29 ENCOUNTER — Other Ambulatory Visit: Payer: Self-pay | Admitting: Diagnostic Neuroimaging

## 2023-10-02 ENCOUNTER — Other Ambulatory Visit: Payer: Self-pay | Admitting: Family Medicine

## 2023-10-05 ENCOUNTER — Ambulatory Visit: Payer: BC Managed Care – PPO | Admitting: Diagnostic Neuroimaging

## 2023-10-22 ENCOUNTER — Ambulatory Visit (INDEPENDENT_AMBULATORY_CARE_PROVIDER_SITE_OTHER): Payer: BC Managed Care – PPO

## 2023-10-22 DIAGNOSIS — Z3042 Encounter for surveillance of injectable contraceptive: Secondary | ICD-10-CM | POA: Diagnosis not present

## 2023-10-22 NOTE — Progress Notes (Signed)
Medroxyprogesterone injection given to right upper outer quadrant.  Patient tolerated well. 

## 2023-10-27 ENCOUNTER — Other Ambulatory Visit: Payer: Self-pay | Admitting: Family Medicine

## 2023-10-27 DIAGNOSIS — I10 Essential (primary) hypertension: Secondary | ICD-10-CM

## 2023-11-04 ENCOUNTER — Encounter: Payer: Self-pay | Admitting: Family Medicine

## 2023-11-05 ENCOUNTER — Other Ambulatory Visit: Payer: Self-pay

## 2023-11-05 DIAGNOSIS — I10 Essential (primary) hypertension: Secondary | ICD-10-CM

## 2023-11-05 MED ORDER — AMLODIPINE BESYLATE 5 MG PO TABS: 5.0000 mg | ORAL_TABLET | Freq: Every day | ORAL | 0 refills | Status: DC

## 2023-11-08 ENCOUNTER — Ambulatory Visit: Payer: BC Managed Care – PPO | Admitting: Family Medicine

## 2023-11-08 ENCOUNTER — Encounter: Payer: Self-pay | Admitting: Family Medicine

## 2023-11-08 VITALS — BP 138/86 | HR 80 | Temp 98.8°F | Ht 66.0 in | Wt 269.0 lb

## 2023-11-08 DIAGNOSIS — G5793 Unspecified mononeuropathy of bilateral lower limbs: Secondary | ICD-10-CM | POA: Diagnosis not present

## 2023-11-08 DIAGNOSIS — F411 Generalized anxiety disorder: Secondary | ICD-10-CM

## 2023-11-08 DIAGNOSIS — G35D Multiple sclerosis, unspecified: Secondary | ICD-10-CM

## 2023-11-08 DIAGNOSIS — R4589 Other symptoms and signs involving emotional state: Secondary | ICD-10-CM | POA: Diagnosis not present

## 2023-11-08 DIAGNOSIS — Z3042 Encounter for surveillance of injectable contraceptive: Secondary | ICD-10-CM

## 2023-11-08 DIAGNOSIS — I10 Essential (primary) hypertension: Secondary | ICD-10-CM

## 2023-11-08 DIAGNOSIS — F32A Depression, unspecified: Secondary | ICD-10-CM

## 2023-11-08 DIAGNOSIS — G35 Multiple sclerosis: Secondary | ICD-10-CM

## 2023-11-08 MED ORDER — MEDROXYPROGESTERONE ACETATE 150 MG/ML IM SUSY: 150.0000 mg | PREFILLED_SYRINGE | INTRAMUSCULAR | 4 refills | Status: DC

## 2023-11-08 MED ORDER — HYDROXYZINE HCL 25 MG PO TABS: 25.0000 mg | ORAL_TABLET | Freq: Three times a day (TID) | ORAL | 3 refills | Status: DC | PRN

## 2023-11-08 MED ORDER — GABAPENTIN 600 MG PO TABS: 600.0000 mg | ORAL_TABLET | Freq: Three times a day (TID) | ORAL | 3 refills | Status: DC

## 2023-11-08 MED ORDER — AMLODIPINE BESYLATE 5 MG PO TABS: 5.0000 mg | ORAL_TABLET | Freq: Every day | ORAL | 3 refills | Status: DC

## 2023-11-08 MED ORDER — CITALOPRAM HYDROBROMIDE 40 MG PO TABS: 40.0000 mg | ORAL_TABLET | Freq: Every day | ORAL | 3 refills | Status: DC

## 2023-11-08 MED ORDER — OMEPRAZOLE 20 MG PO CPDR: DELAYED_RELEASE_CAPSULE | ORAL | 3 refills | Status: DC

## 2023-11-08 NOTE — Progress Notes (Signed)
Subjective: CC: Chronic follow-up PCP: Raliegh Ip, DO Debra Copeland is a 22 y.o. female presenting to clinic today for:  1.  Anxiety, depression Patient is compliant with Celexa.  She notes that recently she was treated for UTI and all of a sudden she started becoming very anxious.  She had some old Atarax and utilize that and wants to start having that on hand again for as needed use.  2.  Itchy/MS She reports that she is been getting some occasional itchiness and redness in the face recently.  This only happens when she wakes up.  She thinks it may be a side effect of her MS medication.  Will be seeing that specialist in the next month or so.  Still using gabapentin 3 times daily and this seems to be working okay.  Her symptoms have been stable  3.  Hypertension Has not had medicine in over a week.  No chest pain, shortness of breath or dizziness reported.   ROS: Per HPI  No Known Allergies Past Medical History:  Diagnosis Date   Anxiety    Depression    Multiple sclerosis (HCC)     Current Outpatient Medications:    Alpha-Lipoic Acid 600 MG CAPS, Take 1 capsule (600 mg total) by mouth daily. For neuropathy, Disp: 90 capsule, Rfl: 1   amLODipine (NORVASC) 5 MG tablet, Take 1 tablet (5 mg total) by mouth daily. for blood pressure **NEEDS TO BE SEEN BEFORE NEXT REFILL**, Disp: 30 tablet, Rfl: 0   citalopram (CELEXA) 40 MG tablet, Take 1 tablet by mouth once daily, Disp: 90 tablet, Rfl: 0   Cyanocobalamin (VITAMIN B-12 PO), Take by mouth., Disp: , Rfl:    gabapentin (NEURONTIN) 600 MG tablet, Take 1 tablet (600 mg total) by mouth 3 (three) times daily., Disp: 270 tablet, Rfl: 3   hydrOXYzine (ATARAX/VISTARIL) 25 MG tablet, Take 1-2 tablets (25-50 mg total) by mouth 3 (three) times daily as needed., Disp: 90 tablet, Rfl: 3   medroxyPROGESTERone Acetate 150 MG/ML SUSY, INJECT 1 ML (150 MG) INTO THE MUSCLE EVERY 3 MONTHS. NEED ANNUAL PHYSICAL, Disp: 1 mL, Rfl: 0    omeprazole (PRILOSEC) 20 MG capsule, TAKE 1 CAPSULE BY MOUTH ONCE DAILY FOR  ACID  REFLUX, Disp: 90 capsule, Rfl: 0   ondansetron (ZOFRAN) 4 MG tablet, Take 1 tablet (4 mg total) by mouth every 8 (eight) hours as needed for nausea or vomiting., Disp: 20 tablet, Rfl: 0   VITAMIN D, CHOLECALCIFEROL, PO, Take by mouth., Disp: , Rfl:    VUMERITY 231 MG CPDR, TAKE 2 CAPSULES BY MOUTH 2 TIMES A DAY, Disp: 120 capsule, Rfl: 2  Current Facility-Administered Medications:    medroxyPROGESTERone (DEPO-PROVERA) injection 150 mg, 150 mg, Intramuscular, Q90 days, [name], DO, 150 mg at 10/22/23 1556 Social History   Socioeconomic History   Marital status: Single    Spouse name: Not on file   Number of children: 0   Years of education: Not on file   Highest education level: Not on file  Occupational History   Not on file  Tobacco Use   Smoking status: Never   Smokeless tobacco: Never  Vaping Use   Vaping status: Never Used  Substance and Sexual Activity   Alcohol use: Yes    Comment: occ   Drug use: Never   Sexual activity: Not Currently    Birth control/protection: Pill, Injection  Other Topics Concern   Not on file  Social History Narrative  Lives with mom dad and brother   Left Handed   Drinks 2-3 cups caffeine daily   Social Drivers of Corporate investment banker Strain: Not on file  Food Insecurity: Not on file  Transportation Needs: Not on file  Physical Activity: Not on file  Stress: Not on file  Social Connections: Not on file  Intimate Partner Violence: Not on file   Family History  Problem Relation Age of Onset   Hypertension Father    Hypothyroidism Father    Vitamin D deficiency Father    Bipolar disorder Maternal Uncle    Bipolar disorder Maternal Grandfather    Depression Paternal Grandmother     Objective: Office vital signs reviewed. BP 138/86   Pulse 80   Temp 98.8 F (37.1 C)   Ht 5\' 6"  (1.676 m)   Wt 269 lb (122 kg)   SpO2 91%   BMI  43.42 kg/m   Physical Examination:  General: Awake, alert, morbidly obese, No acute distress HEENT sclera white.  Moist mucous membranes.  No active rash appreciated Cardio: regular rate and rhythm, S1S2 heard, no murmurs appreciated Pulm: clear to auscultation bilaterally, no wheezes, rhonchi or rales; normal work of breathing on room air MSK: Gait somewhat unsteady but walking independently.  Needs assistance to get on exam table Psych: Mood stable, speech normal, affect appropriate     11/08/2023    3:44 PM 09/11/2022    4:26 PM 07/21/2022    3:38 PM  Depression screen PHQ 2/9  Decreased Interest 0 0 0  Down, Depressed, Hopeless 0 0 0  PHQ - 2 Score 0 0 0  Altered sleeping 0 0 2  Tired, decreased energy 0 1 1  Change in appetite 0 0 0  Feeling bad or failure about yourself  0 0 0  Trouble concentrating 0 0 0  Moving slowly or fidgety/restless 0 0 0  Suicidal thoughts 0 0 0  PHQ-9 Score 0 1 3  Difficult doing work/chores Not difficult at all Not difficult at all Not difficult at all      11/08/2023    3:44 PM 09/11/2022    4:26 PM 07/21/2022    3:38 PM 04/13/2022    2:26 PM  GAD 7 : Generalized Anxiety Score  Nervous, Anxious, on Edge 0 1 0 0  Control/stop worrying 0 0 0 0  Worry too much - different things 0 0 0 0  Trouble relaxing 0 0 0 1  Restless 0 1 0 0  Easily annoyed or irritable 0 0 0 0  Afraid - awful might happen 0 0 0 0  Total GAD 7 Score 0 2 0 1  Anxiety Difficulty  Somewhat difficult Not difficult at all Not difficult at all    Assessment/ Plan: 22 y.o. female   Essential hypertension - Plan: amLODipine (NORVASC) 5 MG tablet, CMP14+EGFR, CMP14+EGFR  Neuropathic pain of both feet - Plan: gabapentin (NEURONTIN) 600 MG tablet  Generalized anxiety disorder - Plan: citalopram (CELEXA) 40 MG tablet, gabapentin (NEURONTIN) 600 MG tablet, hydrOXYzine (ATARAX) 25 MG tablet  Anxiety about health - Plan: citalopram (CELEXA) 40 MG tablet, hydrOXYzine (ATARAX) 25  MG tablet  Depressive disorder - Plan: citalopram (CELEXA) 40 MG tablet  Encounter for surveillance of injectable contraceptive - Plan: medroxyPROGESTERone Acetate 150 MG/ML SUSY  Morbid obesity (HCC) - Plan: CMP14+EGFR, Lipid panel, TSH, VITAMIN D 25 Hydroxy (Vit-D Deficiency, Fractures), Bayer DCA Hb A1c Waived, Bayer DCA Hb A1c Waived, VITAMIN D 25 Hydroxy (Vit-D  Deficiency, Fractures), TSH, Lipid panel, CMP14+EGFR  Multiple sclerosis (HCC) - Plan: CMP14+EGFR, CBC, CBC, CMP14+EGFR  Blood pressure borderline but technically controlled.  Norvasc 5 mg renewed.  Check renal function, liver enzymes  Gabapentin renewed.  Continue to follow-up with neurology as directed for MS  Celexa renewed.  I have also reordered Atarax for as needed use.  Caution sedation.  Will plan for 6-week follow-up to ensure that this is working well for her and she needs no other interventions and then we may follow-up in 1 year for annual physical  Not sexually active nor has she ever been.  Depo for metromenorrhagia.  This has been renewed.  Menses have been absent and she has no vaginal discharge or other concerns today   Raliegh Ip, DO [facility] [phone]

## 2023-11-09 LAB — CMP14+EGFR
ALT: 43 [IU]/L — ABNORMAL HIGH (ref 0–32)
AST: 67 [IU]/L — ABNORMAL HIGH (ref 0–40)
Albumin: 3.9 g/dL — ABNORMAL LOW (ref 4.0–5.0)
Alkaline Phosphatase: 100 [IU]/L (ref 44–121)
BUN/Creatinine Ratio: 6 — ABNORMAL LOW (ref 9–23)
BUN: 3 mg/dL — ABNORMAL LOW (ref 6–20)
Bilirubin Total: 0.5 mg/dL (ref 0.0–1.2)
CO2: 22 mmol/L (ref 20–29)
Calcium: 9 mg/dL (ref 8.7–10.2)
Chloride: 106 mmol/L (ref 96–106)
Creatinine, Ser: 0.52 mg/dL — ABNORMAL LOW (ref 0.57–1.00)
Globulin, Total: 2 g/dL (ref 1.5–4.5)
Glucose: 88 mg/dL (ref 70–99)
Potassium: 4.3 mmol/L (ref 3.5–5.2)
Sodium: 143 mmol/L (ref 134–144)
Total Protein: 5.9 g/dL — ABNORMAL LOW (ref 6.0–8.5)
eGFR: 135 mL/min/{1.73_m2} (ref >59)

## 2023-11-09 LAB — CBC
Hematocrit: 43.7 % (ref 34.0–46.6)
Hemoglobin: 14.1 g/dL (ref 11.1–15.9)
MCH: 30.6 pg (ref 26.6–33.0)
MCHC: 32.3 g/dL (ref 31.5–35.7)
MCV: 95 fL (ref 79–97)
Platelets: 328 10*3/uL (ref 150–450)
RBC: 4.61 x10E6/uL (ref 3.77–5.28)
RDW: 12.3 % (ref 11.7–15.4)
WBC: 6.7 10*3/uL (ref 3.4–10.8)

## 2023-11-09 LAB — BAYER DCA HB A1C WAIVED: HB A1C (BAYER DCA - WAIVED): 4 % — ABNORMAL LOW (ref 4.8–5.6)

## 2023-11-09 LAB — LIPID PANEL
Chol/HDL Ratio: 3.8 {ratio} (ref 0.0–4.4)
Cholesterol, Total: 169 mg/dL (ref 100–199)
HDL: 44 mg/dL (ref >39)
LDL Chol Calc (NIH): 85 mg/dL (ref 0–99)
Triglycerides: 242 mg/dL — ABNORMAL HIGH (ref 0–149)
VLDL Cholesterol Cal: 40 mg/dL (ref 5–40)

## 2023-11-09 LAB — VITAMIN D 25 HYDROXY (VIT D DEFICIENCY, FRACTURES): Vit D, 25-Hydroxy: 36.1 ng/mL (ref 30.0–100.0)

## 2023-11-09 LAB — TSH: TSH: 2.56 u[IU]/mL (ref 0.450–4.500)

## 2023-12-06 ENCOUNTER — Telehealth: Payer: Self-pay | Admitting: Neurology

## 2023-12-06 ENCOUNTER — Ambulatory Visit: Payer: 59 | Admitting: Diagnostic Neuroimaging

## 2023-12-06 ENCOUNTER — Encounter: Payer: Self-pay | Admitting: Diagnostic Neuroimaging

## 2023-12-06 VITALS — BP 131/78 | HR 92 | Ht 66.0 in | Wt 274.0 lb

## 2023-12-06 DIAGNOSIS — G35 Multiple sclerosis: Secondary | ICD-10-CM | POA: Diagnosis not present

## 2023-12-06 NOTE — Progress Notes (Signed)
[facility]  PATIENT: Debra Copeland DOB: 2001/02/02  REFERRING CLINICIAN: Raliegh Ip, DO HISTORY FROM: patient  REASON FOR VISIT: follow up    HISTORICAL  CHIEF COMPLAINT:  Chief Complaint  Patient presents with   Results    Pt in 7 with mom  Pt here for MS f/u Pt states thinks she is having an reaction to vumerity Pt states 1 hour  after taking medication gets itching and hives      HISTORY OF PRESENT ILLNESS:   .UPDATE (12/06/23, VRP): Since last visit, doing about the same. Balance slightly off. No new vision, numbness or weakness. Vumerity still causing some redness, itching sensations. Discussed zeposia in past, but help off due to potential interaction with celexa.   UPDATE (07/20/22, VRP): Since last visit, doing well. Some numbness in feet, but improving. Vision better.   UPDATE (03/10/22, VRP): Since last visit, doing well until 1 month ago; then loss of appetite, numbness in abdomen, legs, feet. Also gait diff. Also right eye blurred vision. Tolerating vumerity, with some flushing.   UPDATE (10/13/21, VRP): Since last visit, doing well, now on vumerity. Vision better. Some muscle spasms in neck and hands.   UPDATE (04/09/21, VRP): Since last visit, doing a little better. Sxs worse with heat or activity. Some mild vision changes and numbness in hands. No other alleviating or aggravating factors.     PRIOR HPI: 23 year old female here for evaluation of vision changes.  Around January 17, 2021 patient noticed abnormal vision mainly from her right eye.  She felt that her central vision was decreased in her right eye when she closed her left eye, but she would see "extra brightness" in this region.  No spots or sparkles.  No dark spots or gaps in visual field.  No pain or headache.  Symptoms continued and slightly affected the left eye.  Patient went to ophthalmology on 02/28/2021 and exam was notable for decreased visual field testing in right greater  than left eye, mainly inferior fields.  Patient was diagnosed with possible optic neuritis and sent to the ER for evaluation.  MRI of the brain and orbits was obtained.  Very subtle T2 hyperintensity was noted within the bilateral optic chiasm and proximal optic nerves, right greater than left side.  No abnormal enhancement noted.  She was recommended to be admitted for further testing and lumbar puncture but patient wanted to go home and follow-up with outpatient neurology.  Patient has had some numbness and cramps in her hands.  No other prodromal accidents injuries or traumas.   REVIEW OF SYSTEMS: Full 14 system review of systems performed and negative with exception of: As per HPI.  ALLERGIES: No Known Allergies  HOME MEDICATIONS: Outpatient Medications Prior to Visit  Medication Sig Dispense Refill   amLODipine (NORVASC) 5 MG tablet Take 1 tablet (5 mg total) by mouth daily. 90 tablet 3   citalopram (CELEXA) 40 MG tablet Take 1 tablet (40 mg total) by mouth daily. 90 tablet 3   gabapentin (NEURONTIN) 600 MG tablet Take 1 tablet (600 mg total) by mouth 3 (three) times daily. 270 tablet 3   hydrOXYzine (ATARAX) 25 MG tablet Take 1-2 tablets (25-50 mg total) by mouth 3 (three) times daily as needed. 90 tablet 3   medroxyPROGESTERone Acetate 150 MG/ML SUSY Inject 1 mL (150 mg total) into the skin every 3 (three) months. 1 mL 4   Multiple Vitamins-Minerals (ONE-A-DAY WOMENS PO) Take by mouth.  omeprazole (PRILOSEC) 20 MG capsule TAKE 1 CAPSULE BY MOUTH ONCE DAILY FOR ACID REFLUX 90 capsule 3   VUMERITY 231 MG CPDR TAKE 2 CAPSULES BY MOUTH 2 TIMES A DAY 120 capsule 2   Alpha-Lipoic Acid 600 MG CAPS Take 1 capsule (600 mg total) by mouth daily. For neuropathy 90 capsule 1   Facility-Administered Medications Prior to Visit  Medication Dose Route Frequency Provider Last Rate Last Admin   medroxyPROGESTERone (DEPO-PROVERA) injection 150 mg  150 mg Intramuscular Q90 days Gottschalk, Ashly M, DO    150 mg at 10/22/23 1556    PAST MEDICAL HISTORY: Past Medical History:  Diagnosis Date   Anxiety    Depression    Multiple sclerosis (HCC)     PAST SURGICAL HISTORY: History reviewed. No pertinent surgical history.  FAMILY HISTORY: Family History  Problem Relation Age of Onset   Hypertension Father    Hypothyroidism Father    Vitamin D deficiency Father    Bipolar disorder Maternal Uncle    Bipolar disorder Maternal Grandfather    Depression Paternal Grandmother    Multiple sclerosis Neg Hx     SOCIAL HISTORY: Social History   Socioeconomic History   Marital status: Single    Spouse name: Not on file   Number of children: 0   Years of education: Not on file   Highest education level: Not on file  Occupational History   Not on file  Tobacco Use   Smoking status: Never   Smokeless tobacco: Never   Tobacco comments:    Pt states vapes with nicotine   Vaping Use   Vaping status: Every Day  Substance and Sexual Activity   Alcohol use: Yes    Alcohol/week: 7.0 standard drinks of alcohol    Types: 7 Shots of liquor per week    Comment: occ   Drug use: Never   Sexual activity: Not Currently    Birth control/protection: Pill, Injection  Other Topics Concern   Not on file  Social History Narrative   Lives with mom dad and brother   Left Handed   Drinks 2-3 cups caffeine daily   Pt not working    Social Drivers of Corporate investment banker Strain: Not on file  Food Insecurity: Not on file  Transportation Needs: Not on file  Physical Activity: Not on file  Stress: Not on file  Social Connections: Not on file  Intimate Partner Violence: Not on file     PHYSICAL EXAM  GENERAL EXAM/CONSTITUTIONAL: Vitals:  Vitals:   12/06/23 1514  BP: 131/78  Pulse: 92  Weight: 274 lb (124.3 kg)  Height: 5\' 6"  (1.676 m)     Body mass index is 44.22 kg/m. Wt Readings from Last 3 Encounters:  12/06/23 274 lb (124.3 kg)  11/08/23 269 lb (122 kg)  09/11/22  273 lb 6.4 oz (124 kg)   Patient is in no distress; well developed, nourished and groomed; neck is supple  CARDIOVASCULAR: Examination of carotid arteries is normal; no carotid bruits Regular rate and rhythm, no murmurs Examination of peripheral vascular system by observation and palpation is normal  EYES: Ophthalmoscopic exam of optic discs and posterior segments is normal; no papilledema or hemorrhages No results found.  MUSCULOSKELETAL: Gait, strength, tone, movements noted in Neurologic exam below  NEUROLOGIC: MENTAL STATUS:      No data to display         awake, alert, oriented to person, place and time recent and remote memory intact normal  attention and concentration language fluent, comprehension intact, naming intact fund of knowledge appropriate  CRANIAL NERVE:  2nd - no papilledema on fundoscopic exam 2nd, 3rd, 4th, 6th - pupis equal and reactive to light, visual fields full to confrontation, extraocular muscles intact, no nystagmus 5th - facial sensation symmetric 7th - facial strength symmetric 8th - hearing intact 9th - palate elevates symmetrically, uvula midline 11th - shoulder shrug symmetric 12th - tongue protrusion midline  MOTOR:  normal bulk and tone, full strength in the BUE, BLE  SENSORY:  normal and symmetric to light touch, temperature, vibration; DECR IN FEET  COORDINATION:  finger-nose-finger, fine finger movements normal  REFLEXES:  deep tendon reflexes present and symmetric  GAIT/STATION:  UNSTEADY GAIT; SLOW STEPS     DIAGNOSTIC DATA (LABS, IMAGING, TESTING) - I reviewed patient records, labs, notes, testing and imaging myself where available.  Lab Results  Component Value Date   WBC 6.7 11/08/2023   HGB 14.1 11/08/2023   HCT 43.7 11/08/2023   MCV 95 11/08/2023   PLT 328 11/08/2023      Component Value Date/Time   NA 143 11/08/2023 1645   K 4.3 11/08/2023 1645   CL 106 11/08/2023 1645   CO2 22 11/08/2023 1645    GLUCOSE 88 11/08/2023 1645   GLUCOSE 115 (H) 02/28/2021 1805   BUN 3 (L) 11/08/2023 1645   CREATININE 0.52 (L) 11/08/2023 1645   CALCIUM 9.0 11/08/2023 1645   PROT 5.9 (L) 11/08/2023 1645   ALBUMIN 3.9 (L) 11/08/2023 1645   ALBUMIN 4.7 03/11/2021 1455   AST 67 (H) 11/08/2023 1645   ALT 43 (H) 11/08/2023 1645   ALKPHOS 100 11/08/2023 1645   BILITOT 0.5 11/08/2023 1645   GFRNONAA >60 02/28/2021 1805   GFRAA 160 11/04/2020 1449   Lab Results  Component Value Date   CHOL 169 11/08/2023   HDL 44 11/08/2023   LDLCALC 85 11/08/2023   TRIG 242 (H) 11/08/2023   CHOLHDL 3.8 11/08/2023   Lab Results  Component Value Date   HGBA1C 4.0 (L) 11/08/2023   Lab Results  Component Value Date   VITAMINB12 1,269 (H) 02/27/2022   Lab Results  Component Value Date   TSH 2.560 11/08/2023    02/28/21 MRI brain / orbits [I reviewed images myself and agree with interpretation. -VRP]  1. No acute intracranial abnormality. 2. Questionable hyperintense T2-weighted signal within the cisternal segment of the right optic nerve, equivocal for optic neuritis. 3. No evidence of intracranial demyelinating disease.  03/26/21 MRI cervical spine with and without contrast imaging: -Chronic demyelinating plaques at C3-4 anterior and centrally, and subtle at C5 on the right and subtle C6-7 on the left.   03/26/21  Normal MRI thoracic spine with without contrast.  03/11/21 VEP - The evoked response was mildly to moderately prolonged on the left, implying mild to moderate slowing along the left visual pathway.  The evoked response was absent on the right, implying severe slowing along the right visual pathways.    03/11/21 CSF STUDIES  WBC 13, RBC 0, PROT 27, GLUC 80   Component Ref Range & Units 4 wk ago 1 mo ago  CNS-IgG Synthesis Rate -9.9 - 3.3 mg/24 h 5.1 High     IgG-Index <0.66 1.39 High      OCB - The patient's CSF contains >5 well defined gamma  restriction bands that are not present in the  patient's  corresponding serum sample. These bands indicate  abnormal synthesis of gammaglobulins in the central  nervous system. This finding is supportive evidence  of central nervous system inflammation, multiple  sclerosis, or infection and should be interpreted in  conjunction with all clinical and laboratory data  pertaining to this patient.   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  No abnormal enhancement in this region.  This appears stable in retrospect compared to 03/26/2021 MRI. -Spinal cord otherwise unremarkable.  No evidence of demyelinating disease within the visualized spinal cord region spanning C5-6 down to L1-2 levels.  03/17/22 MRI brain with and without contrast demonstrating: -Minimal periventricular and subcortical foci of nonspecific T2 hyperintensities. -No acute findings.  No change from 02/28/21.    ASSESSMENT AND PLAN  23 y.o. year old female here with abnormal vision changes in right greater than left eye, abnormal visual field testing, with subtle abnormalities on MRI orbits. Also abnl VEP, MRI cervical spine and LP results. Consistent with multiple sclerosis.   Dx:  1. Multiple sclerosis (HCC)     PLAN:  Demyelinating disease (multiple sclerosis) - plan to switch vumerity to kesimpta (due to side effects) - check MRI brain w/wo - check labs (CBC, CMP, immunoglobulin panel, HepB panel) - continue gabapentin for nerve pain in feet (helping)  Orders Placed This Encounter  Procedures   MR BRAIN W WO CONTRAST   CBC with Differential/Platelet   Comprehensive metabolic panel   Immunoglobulins, QN, A/E/G/M   Hepatitis B core antibody, total   Hepatitis B surface antibody,qualitative   Hepatitis B surface antigen   Return in about 6 months (around 06/04/2024) for MyChart  visit (15 min).    Suanne Marker, MD 12/06/2023, 4:00 PM Certified in Neurology, Neurophysiology and Neuroimaging  [facility] [address], [address] [place], [place] [address] [phone]

## 2023-12-06 NOTE — Telephone Encounter (Signed)
Completed Kesimpta start form and sent information to alongside kesimpta. Received confirmation that it went through.

## 2023-12-07 ENCOUNTER — Telehealth: Payer: Self-pay | Admitting: Diagnostic Neuroimaging

## 2023-12-07 NOTE — Telephone Encounter (Signed)
[employer] state no auth required sent to [facility]king per patient request. [phone]

## 2023-12-08 ENCOUNTER — Other Ambulatory Visit: Payer: Self-pay | Admitting: *Deleted

## 2023-12-08 DIAGNOSIS — D809 Immunodeficiency with predominantly antibody defects, unspecified: Secondary | ICD-10-CM

## 2023-12-08 LAB — COMPREHENSIVE METABOLIC PANEL
ALT: 71 [IU]/L — ABNORMAL HIGH (ref 0–32)
AST: 94 [IU]/L — ABNORMAL HIGH (ref 0–40)
Albumin: 4.1 g/dL (ref 4.0–5.0)
Alkaline Phosphatase: 100 [IU]/L (ref 44–121)
BUN/Creatinine Ratio: 5 — ABNORMAL LOW (ref 9–23)
BUN: 3 mg/dL — ABNORMAL LOW (ref 6–20)
Bilirubin Total: 0.6 mg/dL (ref 0.0–1.2)
CO2: 22 mmol/L (ref 20–29)
Calcium: 9 mg/dL (ref 8.7–10.2)
Chloride: 104 mmol/L (ref 96–106)
Creatinine, Ser: 0.6 mg/dL (ref 0.57–1.00)
Globulin, Total: 1.8 g/dL (ref 1.5–4.5)
Glucose: 94 mg/dL (ref 70–99)
Potassium: 4.3 mmol/L (ref 3.5–5.2)
Sodium: 145 mmol/L — ABNORMAL HIGH (ref 134–144)
Total Protein: 5.9 g/dL — ABNORMAL LOW (ref 6.0–8.5)
eGFR: 130 mL/min/{1.73_m2} (ref >59)

## 2023-12-08 LAB — CBC WITH DIFFERENTIAL/PLATELET
Basophils Absolute: 0 10*3/uL (ref 0.0–0.2)
Basos: 1 %
EOS (ABSOLUTE): 0.5 10*3/uL — ABNORMAL HIGH (ref 0.0–0.4)
Eos: 8 %
Hematocrit: 42.9 % (ref 34.0–46.6)
Hemoglobin: 14.1 g/dL (ref 11.1–15.9)
Immature Grans (Abs): 0.1 10*3/uL (ref 0.0–0.1)
Immature Granulocytes: 1 %
Lymphocytes Absolute: 0.9 10*3/uL (ref 0.7–3.1)
Lymphs: 14 %
MCH: 30.3 pg (ref 26.6–33.0)
MCHC: 32.9 g/dL (ref 31.5–35.7)
MCV: 92 fL (ref 79–97)
Monocytes Absolute: 0.6 10*3/uL (ref 0.1–0.9)
Monocytes: 8 %
Neutrophils Absolute: 4.7 10*3/uL (ref 1.4–7.0)
Neutrophils: 68 %
Platelets: 286 10*3/uL (ref 150–450)
RBC: 4.66 x10E6/uL (ref 3.77–5.28)
RDW: 11.7 % (ref 11.7–15.4)
WBC: 6.8 10*3/uL (ref 3.4–10.8)

## 2023-12-08 LAB — HEPATITIS B CORE ANTIBODY, TOTAL: Hep B Core Total Ab: NEGATIVE

## 2023-12-08 LAB — IMMUNOGLOBULINS A/E/G/M, SERUM
IgA/Immunoglobulin A, Serum: 72 mg/dL — ABNORMAL LOW (ref 87–352)
IgE (Immunoglobulin E), Serum: 7 [IU]/mL (ref 6–495)
IgG (Immunoglobin G), Serum: 489 mg/dL — ABNORMAL LOW (ref 586–1602)
IgM (Immunoglobulin M), Srm: 100 mg/dL (ref 26–217)

## 2023-12-08 LAB — HEPATITIS B SURFACE ANTIGEN: Hepatitis B Surface Ag: NEGATIVE

## 2023-12-08 LAB — HEPATITIS B SURFACE ANTIBODY,QUALITATIVE: Hep B Surface Ab, Qual: NONREACTIVE

## 2023-12-09 ENCOUNTER — Other Ambulatory Visit (HOSPITAL_COMMUNITY): Payer: Self-pay

## 2023-12-09 NOTE — Telephone Encounter (Signed)
I have made the adjustment on the form and faxed it. Received confirmation. Will send a notification to pharmacy advising of the PA

## 2023-12-09 NOTE — Telephone Encounter (Signed)
Alongside Kesimpta [facility]) Received start form BI was completed, PA was required. Bridge on the enrollment for the Kesimpta. Neee to complete Section 5 need loading dose/maintenance dose selected for bridge.This will allow for patient access to medication whaele office is completing PA. Phone: [phone], Fax; [phone].

## 2023-12-10 ENCOUNTER — Telehealth: Payer: Self-pay

## 2023-12-10 ENCOUNTER — Other Ambulatory Visit (HOSPITAL_COMMUNITY): Payer: Self-pay

## 2023-12-10 ENCOUNTER — Encounter: Payer: Self-pay | Admitting: Family Medicine

## 2023-12-10 ENCOUNTER — Encounter: Payer: Self-pay | Admitting: Diagnostic Neuroimaging

## 2023-12-10 DIAGNOSIS — R748 Abnormal levels of other serum enzymes: Secondary | ICD-10-CM

## 2023-12-10 NOTE — Telephone Encounter (Signed)
I was asked to do a PA-however could not find any RX in the chart for this medication-please provide medication information such as dose, QTY, directions etc. Also could not find the start form to try to figure it out. Thanks.

## 2023-12-13 ENCOUNTER — Encounter: Payer: Self-pay | Admitting: Family Medicine

## 2023-12-13 ENCOUNTER — Other Ambulatory Visit (HOSPITAL_COMMUNITY): Payer: Self-pay

## 2023-12-13 NOTE — Telephone Encounter (Signed)
Start form was sent in for Kesimpta. The pt maintenance dose will be 20 mg/0.4 ml SQ injection once a month.  There is a loading dose schedule and the alongside Kesimpta may be able to get her started on the bridge to maintance while waiting on insurance authorization. Here is the start form.

## 2023-12-13 NOTE — Telephone Encounter (Signed)
Pharmacy Patient Advocate Encounter  Received notification from [facility] that Prior Authorization for Kesimpta 20MG /0.4ML auto-injectors has been APPROVED from 12/13/2023 to 12/12/2024   PA #/Case ID/Reference #: [id number]   Member can call [phone] for list of specialty pharmacies where they can fill this medication at.

## 2023-12-13 NOTE — Telephone Encounter (Signed)
Pharmacy Patient Advocate Encounter   Received notification from Physician's Office that prior authorization for Kesimpta 20MG /0.4ML Auto-Injectors is required/requested.   Insurance verification completed.   The patient is insured through [facility] .   Per test claim: PA required; PA submitted to above mentioned insurance via CoverMyMeds Key/confirmation #/[facility] Status is pending

## 2023-12-14 MED ORDER — KESIMPTA 20 MG/0.4ML ~~LOC~~ SOAJ: 20.0000 mg | SUBCUTANEOUS | 12 refills | Status: DC

## 2023-12-15 ENCOUNTER — Other Ambulatory Visit: Payer: Self-pay | Admitting: Diagnostic Neuroimaging

## 2023-12-15 NOTE — Telephone Encounter (Signed)
 Per note on 12/06/23 " plan to switch vumerity to kesimpta  (due to side effects "

## 2023-12-16 MED ORDER — KESIMPTA 20 MG/0.4ML ~~LOC~~ SOAJ: 20.0000 mg | SUBCUTANEOUS | 12 refills | Status: DC

## 2023-12-16 NOTE — Addendum Note (Signed)
 Addended by: Elton Ham on: 12/16/2023 02:53 PM   Modules accepted: Orders

## 2023-12-16 NOTE — Telephone Encounter (Addendum)
 [facility]) prescription  received was not readable could you refax to [phone]  Contact info: [phone]

## 2023-12-16 NOTE — Telephone Encounter (Signed)
 I have sent the script as escribed through epic to [facility] specialty pharmacy for the maintenance dose.

## 2023-12-20 ENCOUNTER — Encounter: Payer: Self-pay | Admitting: Family Medicine

## 2023-12-20 ENCOUNTER — Telehealth (INDEPENDENT_AMBULATORY_CARE_PROVIDER_SITE_OTHER): Payer: 59 | Admitting: Family Medicine

## 2023-12-20 ENCOUNTER — Ambulatory Visit: Payer: Self-pay | Admitting: Family Medicine

## 2023-12-20 DIAGNOSIS — F4010 Social phobia, unspecified: Secondary | ICD-10-CM

## 2023-12-20 DIAGNOSIS — R110 Nausea: Secondary | ICD-10-CM

## 2023-12-20 DIAGNOSIS — F411 Generalized anxiety disorder: Secondary | ICD-10-CM

## 2023-12-20 MED ORDER — ONDANSETRON 4 MG PO TBDP: 4.0000 mg | ORAL_TABLET | Freq: Three times a day (TID) | ORAL | 0 refills | Status: DC | PRN

## 2023-12-20 NOTE — Telephone Encounter (Signed)
 Chief Complaint: Anxiety Symptoms: Worry, not eating, not drinking, trouble sleeping, trouble concentrating, nausea Frequency: Constant Pertinent Negatives: Patient denies suicidal/homicidal/self harm ideation, vomiting, difficulty breathing, heart palpitations Disposition: [] ED /[] Urgent Care (no appt availability in office) / [x] Appointment(In office/virtual)/ []  [facility]/ [] Home Care/ [] Refused Recommended Disposition /[] [facility]/ []  Follow-up with PCP Additional Notes: Patient's mother and DPR Irena Manners called about patient having breakthrough anxiety since Saturday. Patient's mom states that patient started having overwhelming worry of which patient did not say over what, and is not eating or drinking due to fear of throwing up. Patient's mom states that patient has been having racing thoughts and trouble sleeping as a result, scared to leave the home, and has been crying a lot. Patient last properly ate and drank Friday. Patient has history of anxiety and has been on medication which patient has been taking as prescribed besides one missed dose. Patient's mom states that patient does not see a therapist and usually gets a one time script for breakthrough anxiety but this time the additional medication is not helping. Patient is not suicidal/homicidal/or risk of self harm, patient is with her father per mom. Patient's mom notified by this RN per protocol that patient should be seen in the ED or with approval at the office. CAL notified and scheduled video visit for patient through MyChart at 5:15pm. Patient's mom notified of time and agreeable. Patient's mom notified that if patient begins to have suicidal/homicidal ideation to call 911, to which patient's mom verbalized understanding.   Copied from CRM [id number]. Topic: Clinical - Red Word Triage >> Dec 20, 2023 12:20 PM Ivette P wrote: Red Word that prompted transfer to Nurse Triage: Severe Anxiety, crying a lot, can't get  mind off things. Has not eaten in 2 days. Reason for Disposition  [1] SEVERE anxiety (e.g., extremely anxious with intense emotional symptoms such as feeling of unreality, urge to flee, unable to calm down; unable to cope or function) AND [2] not better after 10 minutes of reassurance and Care Advice  Answer Assessment - Initial Assessment Questions 1. CONCERN: "Did anything happen that prompted you to call today?"      "She's been crying a lot and talking about being extremely worried. She's not eating or drinking." 2. ANXIETY SYMPTOMS: "Can you describe how you (your loved one; patient) have been feeling?" (e.g., tense, restless, panicky, anxious, keyed up, overwhelmed, sense of impending doom).      "Crying a lot, not able to eat, feels like she's going to throw up, worried." 3. ONSET: "How long have you been feeling this way?" (e.g., hours, days, weeks)     Saturday 4. SEVERITY: "How would you rate the level of anxiety?" (e.g., 0 - 10; or mild, moderate, severe).     Severe 5. FUNCTIONAL IMPAIRMENT: "How have these feelings affected your ability to do daily activities?" "Have you had more difficulty than usual doing your normal daily activities?" (e.g., getting better, same, worse; self-care, school, work, interactions)     "Ate something Friday evening, last night she ate a couple of bites." 6. HISTORY: "Have you felt this way before?" "Have you ever been diagnosed with an anxiety problem in the past?" (e.g., generalized anxiety disorder, panic attacks, PTSD). If Yes, ask: "How was this problem treated?" (e.g., medicines, counseling, etc.)     Anxiety "She been on medication for about 4 years." 7. RISK OF HARM - SUICIDAL IDEATION: "Do you ever have thoughts of hurting or killing yourself?" If  Yes, ask:  "Do you have these feelings now?" "Do you have a plan on how you would do this?"     Denies 8. TREATMENT:  "What has been done so far to treat this anxiety?" (e.g., medicines, relaxation  strategies). "What has helped?"     Medication "Usually she gets an additional like booster when this happens but this time it's not helping." 9. TREATMENT - THERAPIST: "Do you have a counselor or therapist? Name?"     Denies 10. POTENTIAL TRIGGERS: "Do you drink caffeinated beverages (e.g., coffee, colas, teas), and how much daily?" "Do you drink alcohol or use any drugs?" "Have you started any new medicines recently?"       "I know she missed a dose, she does have a MRI appt Wednesday and a liver scan later that day but she's known that for a while and she's had those scans before" 11. PATIENT SUPPORT: "Who is with you now?" "Who do you live with?" "Do you have family or friends who you can talk to?"        Support with mom, lives with mom 12. OTHER SYMPTOMS: "Do you have any other symptoms?" (e.g., feeling depressed, trouble concentrating, trouble sleeping, trouble breathing, palpitations or fast heartbeat, chest pain, sweating, nausea, or diarrhea)       "Trouble concentrating, trouble sleeping, nausea. 13. PREGNANCY: "Is there any chance you are pregnant?" "When was your last menstrual period?"       Denies  Protocols used: Anxiety and Panic Attack-A-AH

## 2023-12-20 NOTE — Progress Notes (Addendum)
 MyChart Video visit  Subjective: CC: GAD PCP: Raliegh Ip, DO Debra Copeland is a 23 y.o. female. Patient provides verbal consent for consult held via video.  Due to COVID-19 pandemic this visit was conducted virtually. This visit type was conducted due to national recommendations for restrictions regarding the COVID-19 Pandemic (e.g. social distancing, sheltering in place) in an effort to limit this patient's exposure and mitigate transmission in our community. All issues noted in this document were discussed and addressed.  A physical exam was not performed with this format.   Location of patient: home Location of provider: [facility] Others present for call: none  1.  Anxiety disorder She reports that she has had several day history of increased anxiety.  She has been feeling very panicked, describing this as flushing, nausea and difficulty sleeping.  Prior to onset she admits that she was drinking about 5 mixed drinks in a sitting.  She thought perhaps initially this was simply a hangover but symptoms was not getting better.  She has not been eating or drinking well because of the nausea.  She is smoking a THC vape in efforts to deal with anxiety and has been doing this for a while now.  She is not currently under the care of psychiatry or psychology.  She reports no suicidal ideation or visual or auditory hallucinations.  She "feels like she will never feel better".  Last night she took hydroxyzine and Dramamine in efforts to rest and she was able to get a few hours of rest.  She is compliant with Celexa.  However, she feels like she did when she first started Celexa.   ROS: Per HPI  No Known Allergies Past Medical History:  Diagnosis Date   Anxiety    Depression    Multiple sclerosis (HCC)     Current Outpatient Medications:    Alpha-Lipoic Acid 600 MG CAPS, Take 1 capsule (600 mg total) by mouth daily. For neuropathy, Disp: 90 capsule, Rfl: 1   amLODipine (NORVASC) 5 MG  tablet, Take 1 tablet (5 mg total) by mouth daily., Disp: 90 tablet, Rfl: 3   citalopram (CELEXA) 40 MG tablet, Take 1 tablet (40 mg total) by mouth daily., Disp: 90 tablet, Rfl: 3   gabapentin (NEURONTIN) 600 MG tablet, Take 1 tablet (600 mg total) by mouth 3 (three) times daily., Disp: 270 tablet, Rfl: 3   hydrOXYzine (ATARAX) 25 MG tablet, Take 1-2 tablets (25-50 mg total) by mouth 3 (three) times daily as needed., Disp: 90 tablet, Rfl: 3   medroxyPROGESTERone Acetate 150 MG/ML SUSY, Inject 1 mL (150 mg total) into the skin every 3 (three) months., Disp: 1 mL, Rfl: 4   Multiple Vitamins-Minerals (ONE-A-DAY WOMENS PO), Take by mouth., Disp: , Rfl:    Ofatumumab (KESIMPTA) 20 MG/0.4ML SOAJ, Inject 20 mg into the skin every 30 (thirty) days., Disp: 0.4 mL, Rfl: 12   omeprazole (PRILOSEC) 20 MG capsule, TAKE 1 CAPSULE BY MOUTH ONCE DAILY FOR ACID REFLUX, Disp: 90 capsule, Rfl: 3  Current Facility-Administered Medications:    medroxyPROGESTERone (DEPO-PROVERA) injection 150 mg, 150 mg, Intramuscular, Q90 days, Debra Copeland M, DO, 150 mg at 10/22/23 1556  Gen: appears fidgety, anxious Psych: anxious, some difficulty with concentration, speech normal.     12/20/2023    1:12 PM 11/08/2023    3:44 PM 09/11/2022    4:26 PM  Depression screen PHQ 2/9  Decreased Interest 1 0 0  Down, Depressed, Hopeless 1 0 0  PHQ - 2  Score 2 0 0  Altered sleeping  0 0  Tired, decreased energy  0 1  Change in appetite  0 0  Feeling bad or failure about yourself   0 0  Trouble concentrating  0 0  Moving slowly or fidgety/restless  0 0  Suicidal thoughts  0 0  PHQ-9 Score  0 1  Difficult doing work/chores Somewhat difficult Not difficult at all Not difficult at all      12/20/2023    1:13 PM 11/08/2023    3:44 PM 09/11/2022    4:26 PM 07/21/2022    3:38 PM  GAD 7 : Generalized Anxiety Score  Nervous, Anxious, on Edge 2 0 1 0  Control/stop worrying 1 0 0 0  Worry too much - different things 1 0 0 0   Trouble relaxing 1 0 0 0  Restless 1 0 1 0  Easily annoyed or irritable 0 0 0 0  Afraid - awful might happen 1 0 0 0  Total GAD 7 Score 7 0 2 0  Anxiety Difficulty Very difficult  Somewhat difficult Not difficult at all    Assessment/ Plan: 23 y.o. female   Generalized anxiety disorder - Plan: Ambulatory referral to Psychiatry, Ambulatory referral to Integrated Behavioral Health  Social phobia - Plan: Ambulatory referral to Psychiatry, Ambulatory referral to Integrated Behavioral Health  Nausea - Plan: ondansetron (ZOFRAN-ODT) 4 MG disintegrating tablet  He is not well-controlled.  She really seems very uneasy during our video visit today.  I recommended that she consider seeking evaluation at the behavioral health urgent care in [place] and I gave her the information for that site.  She has had failures of multiple SSRIs including Zoloft, Prozac and now Celexa not helping.  She has not found relief with BuSpar or Wellbutrin either in the past.  Atarax is minimally effective.  Concerns for some development of substance dependency as she is drinking in excess and now using a THC containing vape pen.  We discussed the impact that this could have on mental health and in fact exacerbate the symptoms.  She voiced good understanding and will seek care at the urgent care.  In the meantime I have referred her urgently to behavioral health for counseling here in the office as well as psychiatry.  Patient and/or legal guardian verbally consented to [facility] services about presenting concerns and psychiatric consultation as appropriate.  The services will be billed as appropriate for the patient  Start time: 1:02p End time: 1:27p  Total time spent on patient care (including video visit/ documentation): 30 minutes  Aditri Louischarles Hulen Skains, DO [facility] [phone]

## 2023-12-21 ENCOUNTER — Ambulatory Visit (INDEPENDENT_AMBULATORY_CARE_PROVIDER_SITE_OTHER): Payer: 59 | Admitting: Professional Counselor

## 2023-12-21 DIAGNOSIS — F411 Generalized anxiety disorder: Secondary | ICD-10-CM

## 2023-12-21 NOTE — Patient Instructions (Addendum)
Facility Name: [facility] Address: [address] [place], [place] [address] Phone: [phone] Services: Walk-ins for initial encounter, assessment and establish care     If your symptoms worsen or you have thoughts of suicide/homicide, PLEASE SEEK IMMEDIATE MEDICAL ATTENTION.  You may always call:   National Suicide Hotline: 988 or (647) 232-9436 [place] Crisis Line: (740) 805-4258 Crisis Recovery in [place]: [phone]     These are available 24 hours a day, 7 days a week.

## 2023-12-21 NOTE — BH Specialist Note (Signed)
Collaborative Care Initial Assessment  Session Start time: 1:00   Session End time: 2:00  Total time in minutes: 60 min   Type of Contact: Virtual Patient consent obtained:  Yes Types of Service: Collaborative care  Summary  Patient is a 23 yo female being referred to collaborative care by her pcp for anxiety and depression. Patient was engaged and cooperative during session.   Reason for referral in patient/family's own words:  "Bad anxiety"  Patient's goal for today's visit: "I don't want to feel this way anymore'  History of Present illness:   The patient is a 23 year old female with a history of depression, anxiety, and multiple sclerosis who presented for a collaborative care assessment via telehealth, confirming that she was at home during the session. Her chief complaint was severe anxiety symptoms, which began after excessive alcohol consumption on Saturday night. She reported waking up the next morning feeling hungover, followed by nausea, panic, hot flashes, chills, poor sleep, and a diminished appetite. She described feeling on edge, excessively worried, and as though she was going to have a panic attack. These symptoms appeared suddenly and have persisted for nearly four days without improvement.  Given the nature of her symptoms, the behavioral counselor explored the possibility of alcohol withdrawal. An alcohol audit revealed that she had been drinking at least four times per week, typically consuming two to three drinks per occasion over an extended period. The patient acknowledged a possible habit but did not believe she had a problem with alcohol or required treatment. However, considering her recent drinking pattern and the abrupt cessation of alcohol intake, there was concern that she may be experiencing withdrawal symptoms. The behavioral counselor advised her to seek medical attention to prevent further complications, particularly if her symptoms persisted or  worsened.  In addition to anxiety, the patient reported intense feelings of despair, low energy, and uncertainty about what was happening to her. She has a history of depression and anxiety and was previously prescribed Celexa and Hydroxyzine, which she reported had been helpful. However, her anxiety had worsened since October, which she believed might have been triggered by taking an antibiotic. She currently lives with her parents, and her mother joined the session to provide additional insight. The mother expressed confusion about her daughter?Ts condition, and the behavioral counselor advised her to closely monitor the patient and seek emergency care if her symptoms worsened. The counselor recommended visiting [facility] due to the severity of her anxiety and the potential for withdrawal symptoms, and this information was documented in the chart.  The patient denied any current suicidal ideation or self-harm intent but admitted to experiencing intense despair. She has a history of self-harm, specifically cutting, which was discussed in the session. The behavioral counselor educated her on the risks associated with her history of self-harm, current depressive and anxiety symptoms, and potential substance use concerns. Resources were provided, and instructions were given on where to seek immediate care if her symptoms worsened.  At this time, the patient is not appropriate for collaborative care. The behavioral counselor has signed off on her case, documented all recommendations in the chart, and provided educational support on alcohol withdrawal and suicidality.  Clinical Assessment   PHQ-9 Assessments:    12/20/2023    1:12 PM 11/08/2023    3:44 PM 09/11/2022    4:26 PM 07/21/2022    3:38 PM 04/13/2022    2:26 PM  Depression screen PHQ 2/9  Decreased Interest 1 0 0 0  0  Down, Depressed, Hopeless 1 0 0 0 0  PHQ - 2 Score 2 0 0 0 0  Altered sleeping  0 0 2 1  Tired, decreased  energy  0 1 1 1   Change in appetite  0 0 0 0  Feeling bad or failure about yourself   0 0 0 0  Trouble concentrating  0 0 0 0  Moving slowly or fidgety/restless  0 0 0 0  Suicidal thoughts  0 0 0 0  PHQ-9 Score  0 1 3 2   Difficult doing work/chores Somewhat difficult Not difficult at all Not difficult at all Not difficult at all Somewhat difficult    GAD-7 Assessments:    12/21/2023    1:25 PM 12/20/2023    1:13 PM 11/08/2023    3:44 PM 09/11/2022    4:26 PM  GAD 7 : Generalized Anxiety Score  Nervous, Anxious, on Edge 3 2 0 1  Control/stop worrying 3 1 0 0  Worry too much - different things 3 1 0 0  Trouble relaxing 3 1 0 0  Restless 3 1 0 1  Easily annoyed or irritable 0 0 0 0  Afraid - awful might happen 0 1 0 0  Total GAD 7 Score 15 7 0 2  Anxiety Difficulty Very difficult Very difficult  Somewhat difficult     Social History:  Household: Parents, and brother Marital status:  Number of Children:  Employment:  Education:   Psychiatric Review of systems: Insomnia: Only sleeping a few hours. Changes in appetite: not eating Decreased need for sleep: Yes Family history of bipolar disorder: Yes Hallucinations: Yes   Paranoia: Yes    Psychotropic medications: Current medications:  Patient taking medications as prescribed:  Yes or No Side effects reported: Yes or No   Current medications (medication list) Current Outpatient Medications on File Prior to Visit  Medication Sig Dispense Refill   Alpha-Lipoic Acid 600 MG CAPS Take 1 capsule (600 mg total) by mouth daily. For neuropathy 90 capsule 1   amLODipine (NORVASC) 5 MG tablet Take 1 tablet (5 mg total) by mouth daily. 90 tablet 3   citalopram (CELEXA) 40 MG tablet Take 1 tablet (40 mg total) by mouth daily. 90 tablet 3   gabapentin (NEURONTIN) 600 MG tablet Take 1 tablet (600 mg total) by mouth 3 (three) times daily. 270 tablet 3   hydrOXYzine (ATARAX) 25 MG tablet Take 1-2 tablets (25-50 mg total) by mouth 3 (three)  times daily as needed. 90 tablet 3   medroxyPROGESTERone Acetate 150 MG/ML SUSY Inject 1 mL (150 mg total) into the skin every 3 (three) months. 1 mL 4   Multiple Vitamins-Minerals (ONE-A-DAY WOMENS PO) Take by mouth.     Ofatumumab (KESIMPTA) 20 MG/0.4ML SOAJ Inject 20 mg into the skin every 30 (thirty) days. 0.4 mL 12   omeprazole (PRILOSEC) 20 MG capsule TAKE 1 CAPSULE BY MOUTH ONCE DAILY FOR ACID REFLUX 90 capsule 3   ondansetron (ZOFRAN-ODT) 4 MG disintegrating tablet Take 1 tablet (4 mg total) by mouth every 8 (eight) hours as needed for nausea or vomiting. 10 tablet 0   Current Facility-Administered Medications on File Prior to Visit  Medication Dose Route Frequency Provider Last Rate Last Admin   medroxyPROGESTERone (DEPO-PROVERA) injection 150 mg  150 mg Intramuscular Q90 days Gottschalk, Ashly M, DO   150 mg at 10/22/23 1556    Psychiatric History: Past psychiatry diagnosis: Depression, anxiety Patient currently being seen by therapist/psychiatrist: Yes  Prior Suicide Attempts: cutting since middle school, on and off but hasn't since 2022. Reports it was relieving, and for punishment Past psychiatry Hospitalization(s): No Past history of violence: None  Traumatic Experiences: History or current traumatic events (natural disaster, house fire, etc.)? no History or current physical trauma?   History or current emotional trauma?  no History or current sexual trauma?  no History or current domestic or intimate partner violence?  no PTSD symptoms if any traumatic experiences no   Alcohol and/or Substance Use History Psychologist, occupational Health from 12/21/2023 in [facility]  AUDIT-C Score 7        Withdrawal Potential: Yes  Self-harm Behaviors Risk Assessment Self-harm risk factors:  Past self harm, depression, anxiety, alcohol abuse Patient endorses recent thoughts of harming self: Denies  [place] Suicide Severity Rating  Scale:   Guns in the home: Yes locked up safely   Protective factors: Family support  Danger to Others Risk Assessment Danger to others risk factors: None   Patient endorses recent thoughts of harming others:  Denies Dynamic Appraisal of Situational Aggression (DASA):   BH Counselor discussed emergency crisis plan with client and provided local emergency services resources.  Mental status exam:   General Appearance Luretha Murphy:  Casual Eye Contact:  Good Motor Behavior:  Normal Speech:  Normal Level of Consciousness:  Alert Mood:  Depressed Affect:  Depressed Anxiety Level:  Severe Thought Process:  Relevant and Circumstantial Thought Content:  WNL Perception:  Normal Judgment:  Good Insight:  Absent  Diagnosis:   Goals: Increase healthy adjustment to current life circumstances   Interventions: Motivational Interviewing and ACT (Acceptance and Commitment Therapy)   Follow-up Plan: Refer to [facility]

## 2023-12-22 ENCOUNTER — Ambulatory Visit (HOSPITAL_COMMUNITY): Payer: 59

## 2023-12-23 ENCOUNTER — Encounter: Payer: Self-pay | Admitting: Diagnostic Neuroimaging

## 2023-12-27 ENCOUNTER — Ambulatory Visit: Payer: Self-pay | Admitting: Family Medicine

## 2023-12-27 NOTE — Telephone Encounter (Signed)
  Chief Complaint: flu like symptoms not improved on tamiflu Symptoms: anxiety, decreased appetite, difficulty sleeping, occasional dry cough, chills, right ear redness. Frequency: symptoms started 12/18/23. Pertinent Negatives: Patient's mother denies chest pain, difficulty breathing Disposition: [] ED /[x] Urgent Care (no appt availability in office) / [] Appointment(In office/virtual)/ []  [place] [facility]/ [] Home Care/ [] Refused Recommended Disposition /[] [facility]/ []  Follow-up with PCP Additional Notes: Patient diagnosed with the flu last week in ED but was not swabbed. Mother calling in for triage and states the patient has been on tamiflu and symptoms are not improved. Patient has a virtual visit with PCP tomorrow to talk about anxiety but she states the patient feels she needs to be seen today. No available PCP appointments, offered acute visit at nearest [facility] health PCP location and mother states they will go to urgent care instead.   Copied from CRM [id number]. Topic: Clinical - Red Word Triage >> Dec 27, 2023 10:37 AM Patsy Lager T wrote: Red Word that prompted transfer to Nurse Triage: mom is calling from work about patient having high anxiety due to flu like symptoms. Reason for Disposition  [1] HIGH RISK (e.g., age > 64 years, pregnant, HIV+, or chronic medical condition) AND [2]  > 72 hours (3 days) since evaluated by doctor (or NP/PA) AND [3] symptoms not improved  Answer Assessment - Initial Assessment Questions 1. DIAGNOSIS CONFIRMATION: "When was the influenza diagnosed?" "By whom?" "Did you get a test for it?"     12/22/23 diagnosed with flu in the ED. She was not swabbed but they went based off symptoms.  2. MEDICINES: "Were you prescribed any medications for the influenza?"  (e.g., zanamivir [Relenza], oseltamavir [Tamiflu]).      Tamiflu.  3. ONSET of SYMPTOMS: "When did your symptoms start?"     12/18/23.  4. SYMPTOMS: "What symptoms are you most concerned about?"  (e.g., runny nose, stuffy nose, sore throat, cough, breathing difficulty, fever)     Anxiety, cough, "unable to eat or sleep", chills, right ear redness . Mother states she took her to ED for blurred vision and lightheadedness improved after receiving IV fluids.  5. COUGH: "How bad is the cough?"     Occasional, dry cough.  6. FEVER: "Do you have a fever?" If Yes, ask: "What is your temperature, how was it measured, and when did it start?"     Highest temperature was 99.4 on 12/22/23.  7. RESPIRATORY DISTRESS: "Are you having any trouble breathing?" If Yes, ask: "Describe your breathing."      Denies.  8. FLU VACCINE: "Did you receive a flu shot this year?" (e.g., seasonal influenza, H1N1)     Denies.  9. PREGNANCY: "Is there any chance you are pregnant?" "When was your last menstrual period?"     Denies.  10. HIGH RISK for COMPLICATIONS: "Do you have any heart or lung problems? Do you have a weakened immune system?" (e.g., CHF, COPD, asthma, HIV positive, chemotherapy, renal failure, diabetes mellitus, sickle cell anemia)       Multiple sclerosis.  Protocols used: Influenza (Flu) Follow-up Call-A-AH

## 2023-12-27 NOTE — Telephone Encounter (Signed)
 Thank you for this update it looks like [name] saw her last week and recommended consideration for [name] urgent care.  I agree with this recommendation I have also given her information for the behavioral health urgent care in [place] as well during her visit

## 2023-12-28 ENCOUNTER — Telehealth (INDEPENDENT_AMBULATORY_CARE_PROVIDER_SITE_OTHER): Payer: 59 | Admitting: Family Medicine

## 2023-12-28 ENCOUNTER — Encounter: Payer: Self-pay | Admitting: Family Medicine

## 2023-12-28 DIAGNOSIS — F411 Generalized anxiety disorder: Secondary | ICD-10-CM

## 2023-12-28 DIAGNOSIS — G479 Sleep disorder, unspecified: Secondary | ICD-10-CM

## 2023-12-28 DIAGNOSIS — F410 Panic disorder [episodic paroxysmal anxiety] without agoraphobia: Secondary | ICD-10-CM

## 2023-12-28 DIAGNOSIS — F32A Depression, unspecified: Secondary | ICD-10-CM | POA: Diagnosis not present

## 2023-12-28 NOTE — Progress Notes (Signed)
 MyChart Video visit  Subjective: CC: Follow-up anxiety, panic PCP: Raliegh Ip, DO [id number] B Blowers is a 23 y.o. female. Patient provides verbal consent for consult held via video.  Due to COVID-19 pandemic this visit was conducted virtually. This visit type was conducted due to national recommendations for restrictions regarding the COVID-19 Pandemic (e.g. social distancing, sheltering in place) in an effort to limit this patient's exposure and mitigate transmission in our community. All issues noted in this document were discussed and addressed.  A physical exam was not performed with this format.   Location of patient: home Location of provider: [facility] Others present for call: none  1.  Anxiety disorder associate with panic attack Patient was seen 1 week ago.  She notes that she is continue to have severe anxiety and paralyzing panic attacks.  She continues to have poor sleep, difficulty with eating secondary to GI disturbance.  Despite recommendations of having her see someone at the behavioral health urgent care last week she did not seek care.  She did get to see virtual behavioral health and they did recommend that she seek urgent behavioral health care at either [facility] or the 1 in [place].  However again she has not sought this treatment yet.  Instead she went to urgent care in [place] and was diagnosed with flu.  She was started on Tamiflu but she notes symptoms never did resolve.  She was also given Atarax but she notes that even on high doses of Atarax that have been prescribed to myself this is again has not been helpful.  She has been on multiple SSRIs that have been ineffective.  She feels like symptomology is getting worse.  She has not utilized alcohol at all since her last visit.   ROS: Per HPI  No Known Allergies Past Medical History:  Diagnosis Date   Anxiety    Depression    Multiple sclerosis (HCC)     Current Outpatient Medications:    Alpha-Lipoic Acid  600 MG CAPS, Take 1 capsule (600 mg total) by mouth daily. For neuropathy, Disp: 90 capsule, Rfl: 1   amLODipine (NORVASC) 5 MG tablet, Take 1 tablet (5 mg total) by mouth daily., Disp: 90 tablet, Rfl: 3   citalopram (CELEXA) 40 MG tablet, Take 1 tablet (40 mg total) by mouth daily., Disp: 90 tablet, Rfl: 3   gabapentin (NEURONTIN) 600 MG tablet, Take 1 tablet (600 mg total) by mouth 3 (three) times daily., Disp: 270 tablet, Rfl: 3   hydrOXYzine (ATARAX) 25 MG tablet, Take 1-2 tablets (25-50 mg total) by mouth 3 (three) times daily as needed., Disp: 90 tablet, Rfl: 3   medroxyPROGESTERone Acetate 150 MG/ML SUSY, Inject 1 mL (150 mg total) into the skin every 3 (three) months., Disp: 1 mL, Rfl: 4   Multiple Vitamins-Minerals (ONE-A-DAY WOMENS PO), Take by mouth., Disp: , Rfl:    Ofatumumab (KESIMPTA) 20 MG/0.4ML SOAJ, Inject 20 mg into the skin every 30 (thirty) days., Disp: 0.4 mL, Rfl: 12   omeprazole (PRILOSEC) 20 MG capsule, TAKE 1 CAPSULE BY MOUTH ONCE DAILY FOR ACID REFLUX, Disp: 90 capsule, Rfl: 3   ondansetron (ZOFRAN-ODT) 4 MG disintegrating tablet, Take 1 tablet (4 mg total) by mouth every 8 (eight) hours as needed for nausea or vomiting., Disp: 10 tablet, Rfl: 0  Current Facility-Administered Medications:    medroxyPROGESTERone (DEPO-PROVERA) injection 150 mg, 150 mg, Intramuscular, Q90 days, Devanie Galanti M, DO, 150 mg at 10/22/23 1556  Gen: Tearful, anxious appearing. HEENT: She  has blood blisters along the bottom lip where she has been biting her lip  Assessment/ Plan: 23 y.o. female   Generalized anxiety disorder  Depressive disorder  Panic attacks  Sleep difficulties  Again the anxiety, depression and panic attacks are very uncontrolled right now.  I again reiterated absolute need to be seen at the behavioral health urgent care ASAP.  I really asked that she get this done today because I fear that waiting longer will simply worsen her symptomology as evident by the  progression over the last week.  She needs to see a psychiatrist.  The medications that we have prescribed here in the outpatient setting have not been effective and I think that perhaps she may need something like an atypical antipsychotic though I am not quite sure that this is the best modality of treatment for her.  Would really like to have an expert evaluate her and make some decisions about how we can help her with medications.  She of course will also need some intensive therapy that sadly is beyond the scope of practice of our behavioral health specialist here in office  Start time: 11:30a End time: 11:43a  Total time spent on patient care (including video visit/ documentation): 16 minutes  Izzabell Klasen Hulen Skains, DO [facility] [phone]

## 2023-12-30 ENCOUNTER — Telehealth (INDEPENDENT_AMBULATORY_CARE_PROVIDER_SITE_OTHER): Payer: Self-pay | Admitting: Professional Counselor

## 2023-12-30 ENCOUNTER — Ambulatory Visit (HOSPITAL_COMMUNITY): Admission: RE | Admit: 2023-12-30 | Payer: 59 | Source: Ambulatory Visit

## 2023-12-30 DIAGNOSIS — F411 Generalized anxiety disorder: Secondary | ICD-10-CM | POA: Diagnosis not present

## 2023-12-30 NOTE — BH Specialist Note (Unsigned)
 Virtual Behavioral Health Treatment Plan Team Note  MRN: [id number] NAME: Debra Copeland  DATE: 12/31/23  Start time: Start Time: 1000 End time: Stop Time: 1015 Total time: Total Time in Minutes (Visit): 15  Total number of Virtual BH Treatment Team Plan encounters: 1/4  Treatment Team Attendees: Dr. Vivia Copeland and Debra Copeland  Collaborative Copeland Psychiatric Consultant Case Review    Assessment/Provisional Diagnosis Debra Copeland is a 23 y.o. year old female with history of MS, Anxiety. The patient is referred for Anxiety.    Patient seems to be self medicating with alcohol, drinks 4-5 drinks a day on most days of the week.  3 days prior to this visit with Debra Copeland she stopped drinking trying to quit and presented with shakiness, anxiety, panic attacks. She was advised to present to the ED for further evaluation of alcohol withdrawal. AUDIT of 7 Patient is not an appropriate candidate for collaborative Copeland model. Recommend referral to psychiatry.    # GAD, with panic attacks # AUD, mild # MS   Recommendation Refer to psychiatry Patient advised to go to the ED if symptoms worsen.  BH specialist to sign off.     Thank you for your consult. We will sign off on patient case. Please contact our collaborative Copeland team for any questions or concerns.    I have spent 25 minutes reviewing patient chart, documenting and discussing the case with Debra Copeland.   Diagnoses:    ICD-10-CM   1. Generalized anxiety disorder  F41.1       Goals, Interventions and Follow-up Plan Goals: Increase healthy adjustment to current life circumstances Interventions: Motivational Interviewing ACT (Acceptance and Commitment Therapy) Medication Management Recommendations: Defer Follow-up Plan: Refer to [facility]  History of the present illness Presenting Problem/Current Symptoms:  The patient is a 23 year old female with a history of depression, anxiety, and  multiple sclerosis who presented for a collaborative Copeland assessment via telehealth, confirming that she was at home during the session. Her chief complaint was severe anxiety symptoms, which began after excessive alcohol consumption on Saturday night. She reported waking up the next morning feeling hungover, followed by nausea, panic, hot flashes, chills, poor sleep, and a diminished appetite. She described feeling on edge, excessively worried, and as though she was going to have a panic attack. These symptoms appeared suddenly and have persisted for nearly four days without improvement.   Given the nature of her symptoms, the behavioral counselor explored the possibility of alcohol withdrawal. An alcohol audit revealed that she had been drinking at least four times per week, typically consuming two to three drinks per occasion over an extended period. The patient acknowledged a possible habit but did not believe she had a problem with alcohol or required treatment. However, considering her recent drinking pattern and the abrupt cessation of alcohol intake, there was concern that she may be experiencing withdrawal symptoms. The behavioral counselor advised her to seek medical attention to prevent further complications, particularly if her symptoms persisted or worsened.   In addition to anxiety, the patient reported intense feelings of despair, low energy, and uncertainty about what was happening to her. She has a history of depression and anxiety and was previously prescribed Celexa and Hydroxyzine, which she reported had been helpful. However, her anxiety had worsened since October, which she believed might have been triggered by taking an antibiotic. She currently lives with her parents, and her mother joined the session to provide additional insight. The mother expressed  confusion about her daughter?Ts condition, and the behavioral counselor advised her to closely monitor the patient and seek emergency  Copeland if her symptoms worsened. The counselor recommended visiting Debra Copeland due to the severity of her anxiety and the potential for withdrawal symptoms, and this information was documented in the chart.   The patient denied any current suicidal ideation or self-harm intent but admitted to experiencing intense despair. She has a history of self-harm, specifically cutting, which was discussed in the session. The behavioral counselor educated her on the risks associated with her history of self-harm, current depressive and anxiety symptoms, and potential substance use concerns. Resources were provided, and instructions were given on where to seek immediate Copeland if her symptoms worsened.   At this time, the patient is not appropriate for collaborative Copeland. The behavioral counselor has signed off on her case, documented all recommendations in the chart, and provided educational support on alcohol withdrawal and suicidality.     Screenings PHQ-9 Assessments:     12/21/2023    1:30 PM 12/20/2023    1:12 PM 11/08/2023    3:44 PM  Depression screen PHQ 2/9  Decreased Interest 1 1 0  Down, Depressed, Hopeless 2 1 0  PHQ - 2 Score 3 2 0  Altered sleeping 3  0  Tired, decreased energy 3  0  Change in appetite 3  0  Feeling bad or failure about yourself  0  0  Trouble concentrating 3  0  Moving slowly or fidgety/restless 0  0  Suicidal thoughts 0  0  PHQ-9 Score 15  0  Difficult doing work/chores Somewhat difficult Somewhat difficult Not difficult at all   GAD-7 Assessments:     12/21/2023    1:25 PM 12/20/2023    1:13 PM 11/08/2023    3:44 PM 09/11/2022    4:26 PM  GAD 7 : Generalized Anxiety Score  Nervous, Anxious, on Edge 3 2 0 1  Control/stop worrying 3 1 0 0  Worry too much - different things 3 1 0 0  Trouble relaxing 3 1 0 0  Restless 3 1 0 1  Easily annoyed or irritable 0 0 0 0  Afraid - awful might happen 0 1 0 0  Total GAD 7 Score 15 7 0 2  Anxiety  Difficulty Very difficult Very difficult  Somewhat difficult    Past Medical History Past Medical History:  Diagnosis Date   Anxiety    Depression    Multiple sclerosis (HCC)     Vital signs: There were no vitals filed for this visit.  Allergies:  Allergies as of 12/30/2023   (No Known Allergies)    Medication History Current medications:  Outpatient Encounter Medications as of 12/30/2023  Medication Sig   Alpha-Lipoic Acid 600 MG CAPS Take 1 capsule (600 mg total) by mouth daily. For neuropathy   amLODipine (NORVASC) 5 MG tablet Take 1 tablet (5 mg total) by mouth daily.   citalopram (CELEXA) 40 MG tablet Take 1 tablet (40 mg total) by mouth daily.   gabapentin (NEURONTIN) 600 MG tablet Take 1 tablet (600 mg total) by mouth 3 (three) times daily.   hydrOXYzine (ATARAX) 25 MG tablet Take 1-2 tablets (25-50 mg total) by mouth 3 (three) times daily as needed.   medroxyPROGESTERone Acetate 150 MG/ML SUSY Inject 1 mL (150 mg total) into the skin every 3 (three) months.   Multiple Vitamins-Minerals (ONE-A-DAY WOMENS PO) Take by mouth.   Ofatumumab (KESIMPTA) 20 MG/0.4ML  SOAJ Inject 20 mg into the skin every 30 (thirty) days.   omeprazole (PRILOSEC) 20 MG capsule TAKE 1 CAPSULE BY MOUTH ONCE DAILY FOR ACID REFLUX   ondansetron (ZOFRAN-ODT) 4 MG disintegrating tablet Take 1 tablet (4 mg total) by mouth every 8 (eight) hours as needed for nausea or vomiting.   Facility-Administered Encounter Medications as of 12/30/2023  Medication   medroxyPROGESTERone (DEPO-PROVERA) injection 150 mg     Scribe for Treatment Team: Reuel Boom

## 2023-12-31 NOTE — Telephone Encounter (Signed)
 Priya from [facility] pharmacy states re:pt's Kesimpta she informed them she has no loading dose or starter dose. Lilian Kapur is asking that a Rx be sent to them ph#[phone] fax#[phone]  if pt has had loading dose and starter dose they will proceed with maintenance, please call.

## 2024-01-03 NOTE — Telephone Encounter (Signed)
 [facility]) called in again requesting cb to discuss pt's Kesimpta starting dose. Would like a call back at [phone].

## 2024-01-03 NOTE — Telephone Encounter (Signed)
 I called [facility]. They have not filled the starter dose but sent it to [facility] instead.  I called [facility]. Spoke with Delene Loll, PHmD.  Confirmed loading dose of Kesimpta has no refills. The shipment goes out tomorrow.

## 2024-01-07 ENCOUNTER — Ambulatory Visit: Payer: 59

## 2024-01-11 ENCOUNTER — Ambulatory Visit (INDEPENDENT_AMBULATORY_CARE_PROVIDER_SITE_OTHER): Payer: 59

## 2024-01-11 DIAGNOSIS — Z3042 Encounter for surveillance of injectable contraceptive: Secondary | ICD-10-CM

## 2024-01-11 NOTE — Progress Notes (Signed)
 Patient is in office today for a nurse visit for Birth Control Injection. Patient Injection was given in the  Left upper quad. gluteus. Patient tolerated injection well.

## 2024-01-12 ENCOUNTER — Other Ambulatory Visit (HOSPITAL_COMMUNITY): Payer: Self-pay

## 2024-01-17 ENCOUNTER — Encounter: Payer: Self-pay | Admitting: Diagnostic Neuroimaging

## 2024-02-07 ENCOUNTER — Ambulatory Visit: Admitting: Family Medicine

## 2024-02-07 ENCOUNTER — Encounter: Payer: Self-pay | Admitting: Family Medicine

## 2024-02-07 VITALS — BP 113/72 | HR 88 | Temp 98.6°F | Ht 66.0 in | Wt 254.8 lb

## 2024-02-07 DIAGNOSIS — Z30011 Encounter for initial prescription of contraceptive pills: Secondary | ICD-10-CM | POA: Diagnosis not present

## 2024-02-07 DIAGNOSIS — F411 Generalized anxiety disorder: Secondary | ICD-10-CM

## 2024-02-07 DIAGNOSIS — F32A Depression, unspecified: Secondary | ICD-10-CM

## 2024-02-07 DIAGNOSIS — F4010 Social phobia, unspecified: Secondary | ICD-10-CM

## 2024-02-07 MED ORDER — BUSPIRONE HCL 15 MG PO TABS
15.0000 mg | ORAL_TABLET | Freq: Three times a day (TID) | ORAL | 0 refills | Status: DC
Start: 2024-02-07 — End: 2024-03-31

## 2024-02-07 MED ORDER — LEVONORGEST-ETH ESTRAD 91-DAY 0.15-0.03 MG PO TABS: 1.0000 | ORAL_TABLET | Freq: Every day | ORAL | 4 refills | Status: DC

## 2024-02-08 NOTE — Progress Notes (Signed)
 Subjective: CC: Follow-up anxiety disorder PCP: Raliegh Ip, DO [id number] Debra Copeland is a 23 y.o. female presenting to clinic today for:  1.  Generalized anxiety disorder Patient is accompanied today by her mother.  She has been compliant with Celexa 40 mg daily but found that hydroxyzine was really not helping as much as she had hoped with her anxiety and sleep.  She continues to have panic attacks and generalized anxiety.  She notes that she did try to seek care at [facility] but she did not really feel like they were listening to her.  She has tried seeing other specialists but because there is concern for alcohol use disorder they would not see her going forward.  She recently did a virtual visit and was placed on buspirone 10 mg 3 times daily and she does feel like this is helping.  She previously failed this medication at lower doses.  However, she still has some breakthrough anxiety and worries about the future.  2.  Contraception She was on Depo-Provera due to menstrual issues.  She is interested in switching over to something else because of the recent news about meningiomas associated with Depo use.  Has not been on any other medications but would be interested in a continuous OCP so that she could avoid menstrual cycles   ROS: Per HPI  No Known Allergies Past Medical History:  Diagnosis Date   Anxiety    Depression    Multiple sclerosis (HCC)     Current Outpatient Medications:    Alpha-Lipoic Acid 600 MG CAPS, Take 1 capsule (600 mg total) by mouth daily. For neuropathy, Disp: 90 capsule, Rfl: 1   amLODipine (NORVASC) 5 MG tablet, Take 1 tablet (5 mg total) by mouth daily., Disp: 90 tablet, Rfl: 3   citalopram (CELEXA) 40 MG tablet, Take 1 tablet (40 mg total) by mouth daily., Disp: 90 tablet, Rfl: 3   gabapentin (NEURONTIN) 600 MG tablet, Take 1 tablet (600 mg total) by mouth 3 (three) times daily., Disp: 270 tablet, Rfl: 3   hydrOXYzine (ATARAX) 25 MG tablet, Take  1-2 tablets (25-50 mg total) by mouth 3 (three) times daily as needed., Disp: 90 tablet, Rfl: 3   levonorgestrel-ethinyl estradiol (SEASONALE) 0.15-0.03 MG tablet, Take 1 tablet by mouth daily. Start 02/20/2024, Disp: 91 tablet, Rfl: 4   Multiple Vitamins-Minerals (ONE-A-DAY WOMENS PO), Take by mouth., Disp: , Rfl:    Ofatumumab (KESIMPTA) 20 MG/0.4ML SOAJ, Inject 20 mg into the skin every 30 (thirty) days., Disp: 0.4 mL, Rfl: 12   omeprazole (PRILOSEC) 20 MG capsule, TAKE 1 CAPSULE BY MOUTH ONCE DAILY FOR ACID REFLUX, Disp: 90 capsule, Rfl: 3   ondansetron (ZOFRAN-ODT) 4 MG disintegrating tablet, Take 1 tablet (4 mg total) by mouth every 8 (eight) hours as needed for nausea or vomiting., Disp: 10 tablet, Rfl: 0   busPIRone (BUSPAR) 15 MG tablet, Take 1 tablet (15 mg total) by mouth 3 (three) times daily., Disp: 270 tablet, Rfl: 0 Social History   Socioeconomic History   Marital status: Single    Spouse name: Not on file   Number of children: 0   Years of education: Not on file   Highest education level: Not on file  Occupational History   Not on file  Tobacco Use   Smoking status: Never   Smokeless tobacco: Never   Tobacco comments:    Pt states vapes with nicotine   Vaping Use   Vaping status: Every Day  Substance and Sexual  Activity   Alcohol use: Yes    Alcohol/week: 7.0 standard drinks of alcohol    Types: 7 Shots of liquor per week    Comment: occ   Drug use: Never   Sexual activity: Not Currently    Birth control/protection: Pill, Injection  Other Topics Concern   Not on file  Social History Narrative   Lives with mom dad and brother   Left Handed   Drinks 2-3 cups caffeine daily   Pt not working    Social Drivers of Corporate investment banker Strain: Not on file  Food Insecurity: Not on file  Transportation Needs: Not on file  Physical Activity: Not on file  Stress: Not on file  Social Connections: Not on file  Intimate Partner Violence: Not on file   Family  History  Problem Relation Age of Onset   Hypertension Father    Hypothyroidism Father    Vitamin D deficiency Father    Bipolar disorder Maternal Uncle    Bipolar disorder Maternal Grandfather    Depression Paternal Grandmother    Multiple sclerosis Neg Hx     Objective: Office vital signs reviewed. BP 113/72   Pulse 88   Temp 98.6 F (37 C)   Ht 5\' 6"  (1.676 m)   Wt 254 lb 12.8 oz (115.6 kg)   SpO2 96%   BMI 41.13 kg/m   Physical Examination:  General: Awake, alert, morbidly obese, No acute distress HEENT: Sclera white.  Moist mucous membranes Cardio: regular rate and rhythm, S1S2 heard, no murmurs appreciated Pulm: clear to auscultation bilaterally, no wheezes, rhonchi or rales; normal work of breathing on room air Psych: Appears anxious when she is talking about anxiety attacks     12/21/2023    1:30 PM 12/20/2023    1:12 PM 11/08/2023    3:44 PM  Depression screen PHQ 2/9  Decreased Interest 1 1 0  Down, Depressed, Hopeless 2 1 0  PHQ - 2 Score 3 2 0  Altered sleeping 3  0  Tired, decreased energy 3  0  Change in appetite 3  0  Feeling bad or failure about yourself  0  0  Trouble concentrating 3  0  Moving slowly or fidgety/restless 0  0  Suicidal thoughts 0  0  PHQ-9 Score 15  0  Difficult doing work/chores Somewhat difficult Somewhat difficult Not difficult at all      02/07/2024    2:03 PM 12/21/2023    1:25 PM 12/20/2023    1:13 PM 11/08/2023    3:44 PM  GAD 7 : Generalized Anxiety Score  Nervous, Anxious, on Edge 3 3 2  0  Control/stop worrying 3 3 1  0  Worry too much - different things 3 3 1  0  Trouble relaxing 3 3 1  0  Restless 2 3 1  0  Easily annoyed or irritable 1 0 0 0  Afraid - awful might happen 1 0 1 0  Total GAD 7 Score 16 15 7  0  Anxiety Difficulty Extremely difficult Very difficult Very difficult     Assessment/ Plan: 23 y.o. female   Generalized anxiety disorder - Plan: busPIRone (BUSPAR) 15 MG tablet  Depressive  disorder  Social phobia - Plan: busPIRone (BUSPAR) 15 MG tablet  Encounter for initial prescription of contraceptive pills - Plan: levonorgestrel-ethinyl estradiol (SEASONALE) 0.15-0.03 MG tablet  BuSpar increased to 15 mg 3 times daily.  I would like to see her back closely.  I did encourage her to have  an open mind as to seeing [facility] again if her symptoms become uncontrolled and or to help with substance use disorder.  Encouraged her to stay off alcohol totally as this causes relapse for her from a mental health standpoint.  I offered her information about AA but she declined this today  I have also placed her on Seasonale and given instructions on how to utilize prior to her next Depo shot due date.   Raliegh Ip, DO [facility] [phone]

## 2024-02-21 ENCOUNTER — Ambulatory Visit

## 2024-03-31 ENCOUNTER — Telehealth (INDEPENDENT_AMBULATORY_CARE_PROVIDER_SITE_OTHER): Admitting: Family Medicine

## 2024-03-31 ENCOUNTER — Encounter: Payer: Self-pay | Admitting: Family Medicine

## 2024-03-31 DIAGNOSIS — N946 Dysmenorrhea, unspecified: Secondary | ICD-10-CM

## 2024-03-31 DIAGNOSIS — F4010 Social phobia, unspecified: Secondary | ICD-10-CM | POA: Diagnosis not present

## 2024-03-31 DIAGNOSIS — Z793 Long term (current) use of hormonal contraceptives: Secondary | ICD-10-CM

## 2024-03-31 DIAGNOSIS — F32A Depression, unspecified: Secondary | ICD-10-CM | POA: Diagnosis not present

## 2024-03-31 DIAGNOSIS — F411 Generalized anxiety disorder: Secondary | ICD-10-CM | POA: Diagnosis not present

## 2024-03-31 MED ORDER — BUSPIRONE HCL 15 MG PO TABS: 15.0000 mg | ORAL_TABLET | Freq: Three times a day (TID) | ORAL | 3 refills | Status: DC

## 2024-03-31 NOTE — Progress Notes (Signed)
 MyChart Video visit  Subjective: CC:GAD PCP: Eliodoro Guerin, DO EXB:MWUXLKG Debra Copeland is a 23 y.o. female. Patient provides verbal consent for consult held via video.  Due to COVID-19 pandemic this visit was conducted virtually. This visit type was conducted due to national recommendations for restrictions regarding the COVID-19 Pandemic (e.g. social distancing, sheltering in place) in an effort to limit this patient's exposure and mitigate transmission in our community. All issues noted in this document were discussed and addressed.  A physical exam was not performed with this format.   Location of patient: home Location of provider: [facility] Others present for call: none  1. GAD Anxiety has been better controlled with increased dose of Buspar . She did not end up establishing with any counseling services because she doesn't want to. She has not had any alcohol since we last talked.  Has very few episodes of nausea now. She thinks most recent episode was due to rhinorrhea gagging her.  She is back eating normally.  2. Dysmenorrhea She reports that the day she started the OCP, she had an anxiety attack.  She has continued the med and it is working well. No breakthrough bleeding.  ROS: Per HPI  No Known Allergies Past Medical History:  Diagnosis Date   Anxiety    Depression    Multiple sclerosis (HCC)     Current Outpatient Medications:    Alpha-Lipoic Acid 600 MG CAPS, Take 1 capsule (600 mg total) by mouth daily. For neuropathy, Disp: 90 capsule, Rfl: 1   amLODipine  (NORVASC ) 5 MG tablet, Take 1 tablet (5 mg total) by mouth daily., Disp: 90 tablet, Rfl: 3   busPIRone  (BUSPAR ) 15 MG tablet, Take 1 tablet (15 mg total) by mouth 3 (three) times daily., Disp: 270 tablet, Rfl: 0   citalopram  (CELEXA ) 40 MG tablet, Take 1 tablet (40 mg total) by mouth daily., Disp: 90 tablet, Rfl: 3   gabapentin  (NEURONTIN ) 600 MG tablet, Take 1 tablet (600 mg total) by mouth 3 (three) times daily.,  Disp: 270 tablet, Rfl: 3   hydrOXYzine  (ATARAX ) 25 MG tablet, Take 1-2 tablets (25-50 mg total) by mouth 3 (three) times daily as needed., Disp: 90 tablet, Rfl: 3   levonorgestrel-ethinyl estradiol (SEASONALE) 0.15-0.03 MG tablet, Take 1 tablet by mouth daily. Start 02/20/2024, Disp: 91 tablet, Rfl: 4   Multiple Vitamins-Minerals (ONE-A-DAY WOMENS PO), Take by mouth., Disp: , Rfl:    Ofatumumab  (KESIMPTA ) 20 MG/0.4ML SOAJ, Inject 20 mg into the skin every 30 (thirty) days., Disp: 0.4 mL, Rfl: 12   omeprazole  (PRILOSEC) 20 MG capsule, TAKE 1 CAPSULE BY MOUTH ONCE DAILY FOR ACID REFLUX, Disp: 90 capsule, Rfl: 3   ondansetron  (ZOFRAN -ODT) 4 MG disintegrating tablet, Take 1 tablet (4 mg total) by mouth every 8 (eight) hours as needed for nausea or vomiting., Disp: 10 tablet, Rfl: 0  Gen: well appearing female, NAD Psych: mood stable, speech normal      03/31/2024    1:03 PM 12/21/2023    1:30 PM 12/20/2023    1:12 PM  Depression screen PHQ 2/9  Decreased Interest 1 1 1   Down, Depressed, Hopeless 0 2 1  PHQ - 2 Score 1 3 2   Altered sleeping  3   Tired, decreased energy  3   Change in appetite  3   Feeling bad or failure about yourself   0   Trouble concentrating  3   Moving slowly or fidgety/restless  0   Suicidal thoughts  0   PHQ-9  Score  15   Difficult doing work/chores  Somewhat difficult Somewhat difficult      03/31/2024    1:04 PM 02/07/2024    2:03 PM 12/21/2023    1:25 PM 12/20/2023    1:13 PM  GAD 7 : Generalized Anxiety Score  Nervous, Anxious, on Edge 2 3 3 2   Control/stop worrying 1 3 3 1   Worry too much - different things 1 3 3 1   Trouble relaxing 2 3 3 1   Restless 2 2 3 1   Easily annoyed or irritable 1 1 0 0  Afraid - awful might happen 1 1 0 1  Total GAD 7 Score 10 16 15 7   Anxiety Difficulty Somewhat difficult Extremely difficult Very difficult Very difficult     Assessment/ Plan: 24 y.o. female   Generalized anxiety disorder - Plan: busPIRone  (BUSPAR ) 15 MG  tablet  Social phobia - Plan: busPIRone  (BUSPAR ) 15 MG tablet  Depressive disorder  Dysmenorrhea treated with oral contraceptive  Doing much better mentally. I still think should would benefit from counseling services. Didn't want to change meds today.   Tolerating OCP.  Will plan for 3 m virtual follow up, she will call sooner if concerns arise  Start time: 12:58pm End time: 1:05pm  Total time spent on patient care (including video visit/ documentation): 11 minutes  Debra Copeland Debra Bonine, DO [facility] [phone]

## 2024-04-05 ENCOUNTER — Encounter (INDEPENDENT_AMBULATORY_CARE_PROVIDER_SITE_OTHER): Payer: Self-pay | Admitting: Family Medicine

## 2024-04-05 DIAGNOSIS — Z793 Long term (current) use of hormonal contraceptives: Secondary | ICD-10-CM

## 2024-04-05 DIAGNOSIS — N946 Dysmenorrhea, unspecified: Secondary | ICD-10-CM

## 2024-04-06 ENCOUNTER — Encounter: Payer: Self-pay | Admitting: Family Medicine

## 2024-04-10 MED ORDER — MEDROXYPROGESTERONE ACETATE 150 MG/ML IM SUSP: 150.0000 mg | INTRAMUSCULAR | 3 refills | Status: DC

## 2024-04-10 NOTE — Telephone Encounter (Signed)

## 2024-04-25 ENCOUNTER — Ambulatory Visit (INDEPENDENT_AMBULATORY_CARE_PROVIDER_SITE_OTHER)

## 2024-04-25 DIAGNOSIS — Z3042 Encounter for surveillance of injectable contraceptive: Secondary | ICD-10-CM | POA: Diagnosis not present

## 2024-04-25 LAB — PREGNANCY, URINE: Preg Test, Ur: NEGATIVE

## 2024-04-25 MED ORDER — MEDROXYPROGESTERONE ACETATE 150 MG/ML IM SUSY
150.0000 mg | PREFILLED_SYRINGE | INTRAMUSCULAR | Status: AC
  Administered 2024-04-25: 150 mg via INTRAMUSCULAR

## 2024-04-25 NOTE — Progress Notes (Signed)
 Patient is in office today for a nurse visit for Birth Control Injection. Patient Injection was given in the  Left upper quad. gluteus. Patient tolerated injection well.

## 2024-06-05 ENCOUNTER — Telehealth (INDEPENDENT_AMBULATORY_CARE_PROVIDER_SITE_OTHER): Payer: 59 | Admitting: Diagnostic Neuroimaging

## 2024-06-05 ENCOUNTER — Encounter: Payer: Self-pay | Admitting: Diagnostic Neuroimaging

## 2024-06-05 DIAGNOSIS — G35 Multiple sclerosis: Secondary | ICD-10-CM | POA: Diagnosis not present

## 2024-06-05 NOTE — Progress Notes (Signed)
 [facility]  PATIENT: Debra Copeland DOB: 16-Feb-2001  REFERRING CLINICIAN: Jolinda Norene HERO, DO HISTORY FROM: patient  REASON FOR VISIT: follow up    HISTORICAL  CHIEF COMPLAINT:  Chief Complaint  Patient presents with   Multiple Sclerosis     HISTORY OF PRESENT ILLNESS:   UPDATE (06/05/24, VRP): Since last visit, doing well on kesimpta  since Feb 2025. No new symptoms. No more side effects from vumerity.   UPDATE (12/06/23, VRP): Since last visit, doing about the same. Balance slightly off. No new vision, numbness or weakness. Vumerity still causing some redness, itching sensations. Discussed zeposia in past, but help off due to potential interaction with celexa .   UPDATE (07/20/22, VRP): Since last visit, doing well. Some numbness in feet, but improving. Vision better.   UPDATE (03/10/22, VRP): Since last visit, doing well until 1 month ago; then loss of appetite, numbness in abdomen, legs, feet. Also gait diff. Also right eye blurred vision. Tolerating vumerity, with some flushing.   UPDATE (10/13/21, VRP): Since last visit, doing well, now on vumerity. Vision better. Some muscle spasms in neck and hands.   UPDATE (04/09/21, VRP): Since last visit, doing a little better. Sxs worse with heat or activity. Some mild vision changes and numbness in hands. No other alleviating or aggravating factors.     PRIOR HPI: 23 year old female here for evaluation of vision changes.  Around January 17, 2021 patient noticed abnormal vision mainly from her right eye.  She felt that her central vision was decreased in her right eye when she closed her left eye, but she would see extra brightness in this region.  No spots or sparkles.  No dark spots or gaps in visual field.  No pain or headache.  Symptoms continued and slightly affected the left eye.  Patient went to ophthalmology on 02/28/2021 and exam was notable for decreased visual field testing in right greater than left eye,  mainly inferior fields.  Patient was diagnosed with possible optic neuritis and sent to the ER for evaluation.  MRI of the brain and orbits was obtained.  Very subtle T2 hyperintensity was noted within the bilateral optic chiasm and proximal optic nerves, right greater than left side.  No abnormal enhancement noted.  She was recommended to be admitted for further testing and lumbar puncture but patient wanted to go home and follow-up with outpatient neurology.  Patient has had some numbness and cramps in her hands.  No other prodromal accidents injuries or traumas.   REVIEW OF SYSTEMS: Full 14 system review of systems performed and negative with exception of: As per HPI.  ALLERGIES: No Known Allergies  HOME MEDICATIONS: Outpatient Medications Prior to Visit  Medication Sig Dispense Refill   Alpha-Lipoic Acid 600 MG CAPS Take 1 capsule (600 mg total) by mouth daily. For neuropathy 90 capsule 1   amLODipine  (NORVASC ) 5 MG tablet Take 1 tablet (5 mg total) by mouth daily. 90 tablet 3   busPIRone  (BUSPAR ) 15 MG tablet Take 1 tablet (15 mg total) by mouth 3 (three) times daily. 270 tablet 3   citalopram  (CELEXA ) 40 MG tablet Take 1 tablet (40 mg total) by mouth daily. 90 tablet 3   gabapentin  (NEURONTIN ) 600 MG tablet Take 1 tablet (600 mg total) by mouth 3 (three) times daily. 270 tablet 3   hydrOXYzine  (ATARAX ) 25 MG tablet Take 1-2 tablets (25-50 mg total) by mouth 3 (three) times daily as needed. 90 tablet 3   medroxyPROGESTERone  (DEPO-PROVERA ) 150 MG/ML  injection Inject 1 mL (150 mg total) into the muscle every 3 (three) months. To replace OCPs 1 mL 3   Multiple Vitamins-Minerals (ONE-A-DAY WOMENS PO) Take by mouth.     Ofatumumab  (KESIMPTA ) 20 MG/0.4ML SOAJ Inject 20 mg into the skin every 30 (thirty) days. 0.4 mL 12   omeprazole  (PRILOSEC) 20 MG capsule TAKE 1 CAPSULE BY MOUTH ONCE DAILY FOR ACID REFLUX 90 capsule 3   ondansetron  (ZOFRAN -ODT) 4 MG disintegrating tablet Take 1 tablet (4 mg  total) by mouth every 8 (eight) hours as needed for nausea or vomiting. 10 tablet 0   No facility-administered medications prior to visit.    PAST MEDICAL HISTORY: Past Medical History:  Diagnosis Date   Anxiety    Depression    Multiple sclerosis (HCC)     PAST SURGICAL HISTORY: No past surgical history on file.  FAMILY HISTORY: Family History  Problem Relation Age of Onset   Hypertension Father    Hypothyroidism Father    Vitamin D  deficiency Father    Bipolar disorder Maternal Uncle    Bipolar disorder Maternal Grandfather    Depression Paternal Grandmother    Multiple sclerosis Neg Hx     SOCIAL HISTORY: Social History   Socioeconomic History   Marital status: Single    Spouse name: Not on file   Number of children: 0   Years of education: Not on file   Highest education level: Not on file  Occupational History   Not on file  Tobacco Use   Smoking status: Never   Smokeless tobacco: Never   Tobacco comments:    Pt states vapes with nicotine   Vaping Use   Vaping status: Every Day  Substance and Sexual Activity   Alcohol use: Yes    Alcohol/week: 7.0 standard drinks of alcohol    Types: 7 Shots of liquor per week    Comment: occ   Drug use: Never   Sexual activity: Not Currently    Birth control/protection: Pill, Injection  Other Topics Concern   Not on file  Social History Narrative   Lives with mom dad and brother   Left Handed   Drinks 2-3 cups caffeine daily   Pt not working    Social Drivers of Corporate investment banker Strain: Not on file  Food Insecurity: Not on file  Transportation Needs: Not on file  Physical Activity: Not on file  Stress: Not on file  Social Connections: Not on file  Intimate Partner Violence: Not on file     PHYSICAL EXAM  GENERAL EXAM/CONSTITUTIONAL: Vitals:  There were no vitals filed for this visit.    There is no height or weight on file to calculate BMI. Wt Readings from Last 3 Encounters:   02/07/24 254 lb 12.8 oz (115.6 kg)  12/06/23 274 lb (124.3 kg)  11/08/23 269 lb (122 kg)   Patient is in no distress; well developed, nourished and groomed; neck is supple  CARDIOVASCULAR: Examination of carotid arteries is normal; no carotid bruits Regular rate and rhythm, no murmurs Examination of peripheral vascular system by observation and palpation is normal  EYES: Ophthalmoscopic exam of optic discs and posterior segments is normal; no papilledema or hemorrhages No results found.  MUSCULOSKELETAL: Gait, strength, tone, movements noted in Neurologic exam below  NEUROLOGIC: MENTAL STATUS:      No data to display         awake, alert, oriented to person, place and time recent and remote memory intact normal  attention and concentration language fluent, comprehension intact, naming intact fund of knowledge appropriate  CRANIAL NERVE:  2nd - no papilledema on fundoscopic exam 2nd, 3rd, 4th, 6th - pupis equal and reactive to light, visual fields full to confrontation, extraocular muscles intact, no nystagmus 5th - facial sensation symmetric 7th - facial strength symmetric 8th - hearing intact 9th - palate elevates symmetrically, uvula midline 11th - shoulder shrug symmetric 12th - tongue protrusion midline  MOTOR:  normal bulk and tone, full strength in the BUE, BLE  SENSORY:  normal and symmetric to light touch, temperature, vibration; DECR IN FEET  COORDINATION:  finger-nose-finger, fine finger movements normal  REFLEXES:  deep tendon reflexes present and symmetric  GAIT/STATION:  UNSTEADY GAIT; SLOW STEPS     DIAGNOSTIC DATA (LABS, IMAGING, TESTING) - I reviewed patient records, labs, notes, testing and imaging myself where available.  Lab Results  Component Value Date   WBC 6.8 12/06/2023   HGB 14.1 12/06/2023   HCT 42.9 12/06/2023   MCV 92 12/06/2023   PLT 286 12/06/2023      Component Value Date/Time   NA 145 (H) 12/06/2023 1610   K  4.3 12/06/2023 1610   CL 104 12/06/2023 1610   CO2 22 12/06/2023 1610   GLUCOSE 94 12/06/2023 1610   GLUCOSE 115 (H) 02/28/2021 1805   BUN 3 (L) 12/06/2023 1610   CREATININE 0.60 12/06/2023 1610   CALCIUM 9.0 12/06/2023 1610   PROT 5.9 (L) 12/06/2023 1610   ALBUMIN 4.1 12/06/2023 1610   ALBUMIN 4.7 03/11/2021 1455   AST 94 (H) 12/06/2023 1610   ALT 71 (H) 12/06/2023 1610   ALKPHOS 100 12/06/2023 1610   BILITOT 0.6 12/06/2023 1610   GFRNONAA >60 02/28/2021 1805   GFRAA 160 11/04/2020 1449   Lab Results  Component Value Date   CHOL 169 11/08/2023   HDL 44 11/08/2023   LDLCALC 85 11/08/2023   TRIG 242 (H) 11/08/2023   CHOLHDL 3.8 11/08/2023   Lab Results  Component Value Date   HGBA1C 4.0 (L) 11/08/2023   Lab Results  Component Value Date   VITAMINB12 1,269 (H) 02/27/2022   Lab Results  Component Value Date   TSH 2.560 11/08/2023    02/28/21 MRI brain / orbits [I reviewed images myself and agree with interpretation. -VRP]  1. No acute intracranial abnormality. 2. Questionable hyperintense T2-weighted signal within the cisternal segment of the right optic nerve, equivocal for optic neuritis. 3. No evidence of intracranial demyelinating disease.  03/26/21 MRI cervical spine with and without contrast imaging: -Chronic demyelinating plaques at C3-4 anterior and centrally, and subtle at C5 on the right and subtle C6-7 on the left.   03/26/21  Normal MRI thoracic spine with without contrast.  03/11/21 VEP - The evoked response was mildly to moderately prolonged on the left, implying mild to moderate slowing along the left visual pathway.  The evoked response was absent on the right, implying severe slowing along the right visual pathways.    03/11/21 CSF STUDIES  WBC 13, RBC 0, PROT 27, GLUC 80   Component Ref Range & Units 4 wk ago 1 mo ago  CNS-IgG Synthesis Rate -9.9 - 3.3 mg/24 h 5.1 High     IgG-Index <0.66 1.39 High      OCB - The patient's CSF contains >5 well  defined gamma  restriction bands that are not present in the patient's  corresponding serum sample. These bands indicate  abnormal synthesis of gammaglobulins in the central  nervous system. This finding is supportive evidence  of central nervous system inflammation, multiple  sclerosis, or infection and should be interpreted in  conjunction with all clinical and laboratory data  pertaining to this patient.   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  No abnormal enhancement in this region.  This appears stable in retrospect compared to 03/26/2021 MRI. -Spinal cord otherwise unremarkable.  No evidence of demyelinating disease within the visualized spinal cord region spanning C5-6 down to L1-2 levels.  03/17/22 MRI brain with and without contrast demonstrating: -Minimal periventricular and subcortical foci of nonspecific T2 hyperintensities. -No acute findings.  No change from 02/28/21.    ASSESSMENT AND PLAN  23 y.o. year old female here with abnormal vision changes in right greater than left eye, abnormal visual field testing, with subtle abnormalities on MRI orbits. Also abnl VEP, MRI cervical spine and LP results. Consistent with multiple sclerosis.   Meds tried: vumerity (side effects)  Dx:  1. Multiple sclerosis (HCC)      PLAN:  Demyelinating disease (multiple sclerosis) - continue kesimpta  (due to side effects) - check labs (CBC, CMP, immunoglobulin panel, HepB panel) - continue gabapentin  for nerve pain in feet (helping)  Orders Placed This Encounter  Procedures   CBC with Differential/Platelet   Comprehensive metabolic panel with GFR   Immunoglobulins, QN, A/E/G/M   Return in about 6 months (around 12/06/2024).  Virtual Visit via Video Note  I connected with Debra Copeland on  06/05/2024 at  2:45 PM EDT by a video enabled telemedicine application and verified that I am speaking with the correct person using two identifiers.   I discussed the limitations of evaluation and management by telemedicine and the availability of in person appointments. The patient expressed understanding and agreed to proceed.  Patient is at home and I am at the office.   I spent 20 minutes of face-to-face and non-face-to-face time with patient.  This included previsit chart review, lab review, study review, order entry, electronic health record documentation, patient education.     EDUARD FABIENE HANLON, MD 06/05/2024, 3:05 PM Certified in Neurology, Neurophysiology and Neuroimaging  [facility] [address] [place], [place] [address] [phone]

## 2024-06-28 ENCOUNTER — Other Ambulatory Visit: Payer: Self-pay | Admitting: Diagnostic Neuroimaging

## 2024-06-29 ENCOUNTER — Other Ambulatory Visit: Payer: Self-pay

## 2024-06-29 MED ORDER — KESIMPTA 20 MG/0.4ML ~~LOC~~ SOAJ: 20.0000 mg | SUBCUTANEOUS | 12 refills | Status: DC

## 2024-07-03 ENCOUNTER — Encounter: Payer: Self-pay | Admitting: Family Medicine

## 2024-07-03 ENCOUNTER — Telehealth (INDEPENDENT_AMBULATORY_CARE_PROVIDER_SITE_OTHER): Admitting: Family Medicine

## 2024-07-03 DIAGNOSIS — F32A Depression, unspecified: Secondary | ICD-10-CM | POA: Diagnosis not present

## 2024-07-03 DIAGNOSIS — F411 Generalized anxiety disorder: Secondary | ICD-10-CM | POA: Diagnosis not present

## 2024-07-03 DIAGNOSIS — R4589 Other symptoms and signs involving emotional state: Secondary | ICD-10-CM | POA: Diagnosis not present

## 2024-07-03 MED ORDER — CITALOPRAM HYDROBROMIDE 40 MG PO TABS: 40.0000 mg | ORAL_TABLET | Freq: Every day | ORAL | 3 refills | Status: DC

## 2024-07-03 NOTE — Progress Notes (Signed)
 MyChart Video visit  Subjective: CC:GAD PCP: Jolinda Norene HERO, DO YEP:Debra Copeland is a 23 y.o. female. Patient provides verbal consent for consult held via video.  Due to COVID-19 pandemic this visit was conducted virtually. This visit type was conducted due to national recommendations for restrictions regarding the COVID-19 Pandemic (e.g. social distancing, sheltering in place) in an effort to limit this patient's exposure and mitigate transmission in our community. All issues noted in this document were discussed and addressed.  A physical exam was not performed with this format.   Location of patient: home Location of provider: [facility] Others present for call: none  1. GAD Wants to stop taking the buspar .  She doesn't feel a need for it anymore since she thinks the anxiety was related to suddenly stopping smoking.  Celexa  works well. She reports things are going well now.  She feels back at her baseline.    ROS: Per HPI  No Known Allergies Past Medical History:  Diagnosis Date   Anxiety    Depression    Multiple sclerosis (HCC)     Current Outpatient Medications:    Alpha-Lipoic Acid 600 MG CAPS, Take 1 capsule (600 mg total) by mouth daily. For neuropathy, Disp: 90 capsule, Rfl: 1   amLODipine  (NORVASC ) 5 MG tablet, Take 1 tablet (5 mg total) by mouth daily., Disp: 90 tablet, Rfl: 3   busPIRone  (BUSPAR ) 15 MG tablet, Take 1 tablet (15 mg total) by mouth 3 (three) times daily., Disp: 270 tablet, Rfl: 3   citalopram  (CELEXA ) 40 MG tablet, Take 1 tablet (40 mg total) by mouth daily., Disp: 90 tablet, Rfl: 3   gabapentin  (NEURONTIN ) 600 MG tablet, Take 1 tablet (600 mg total) by mouth 3 (three) times daily., Disp: 270 tablet, Rfl: 3   hydrOXYzine  (ATARAX ) 25 MG tablet, Take 1-2 tablets (25-50 mg total) by mouth 3 (three) times daily as needed., Disp: 90 tablet, Rfl: 3   medroxyPROGESTERone  (DEPO-PROVERA ) 150 MG/ML injection, Inject 1 mL (150 mg total) into the muscle every 3  (three) months. To replace OCPs, Disp: 1 mL, Rfl: 3   Multiple Vitamins-Minerals (ONE-A-DAY WOMENS PO), Take by mouth., Disp: , Rfl:    Ofatumumab  (KESIMPTA ) 20 MG/0.4ML SOAJ, Inject 20 mg into the skin every 30 (thirty) days., Disp: 0.4 mL, Rfl: 12   omeprazole  (PRILOSEC) 20 MG capsule, TAKE 1 CAPSULE BY MOUTH ONCE DAILY FOR ACID REFLUX, Disp: 90 capsule, Rfl: 3   ondansetron  (ZOFRAN -ODT) 4 MG disintegrating tablet, Take 1 tablet (4 mg total) by mouth every 8 (eight) hours as needed for nausea or vomiting., Disp: 10 tablet, Rfl: 0  Gen: well appearing female, NAD Psych: mood stable, speech normal, affect appropriate.     07/03/2024    1:24 PM 03/31/2024    1:04 PM 02/07/2024    2:03 PM 12/21/2023    1:25 PM  GAD 7 : Generalized Anxiety Score  Nervous, Anxious, on Edge 0 2 3 3   Control/stop worrying 0 1 3 3   Worry too much - different things 1 1 3 3   Trouble relaxing 1 2 3 3   Restless 2 2 2 3   Easily annoyed or irritable 2 1 1  0  Afraid - awful might happen 0 1 1 0  Total GAD 7 Score 6 10 16 15   Anxiety Difficulty Somewhat difficult Somewhat difficult Extremely difficult Very difficult     Assessment/ Plan: 23 y.o. female   Generalized anxiety disorder - Plan: citalopram  (CELEXA ) 40 MG tablet  Anxiety about  health - Plan: citalopram  (CELEXA ) 40 MG tablet  Depressive disorder - Plan: citalopram  (CELEXA ) 40 MG tablet  Anxiety getting better.  Ok to try weaning from Buspar .  Discussed reducing by 1 tablet weekly until off.  Ok to resume if needed as well.  She will reach out on mychart if needed.  Start time: 1:22pm End time: 1:27pm  Total time spent on patient care (including video visit/ documentation): 7 minutes  Debra Prell CHRISTELLA Fielding, DO [facility] [phone]

## 2024-07-17 ENCOUNTER — Ambulatory Visit (INDEPENDENT_AMBULATORY_CARE_PROVIDER_SITE_OTHER): Admitting: *Deleted

## 2024-07-17 DIAGNOSIS — Z3042 Encounter for surveillance of injectable contraceptive: Secondary | ICD-10-CM

## 2024-07-17 MED ORDER — MEDROXYPROGESTERONE ACETATE 150 MG/ML IM SUSP
150.0000 mg | INTRAMUSCULAR | Status: AC
  Administered 2024-07-17: 150 mg via INTRAMUSCULAR

## 2024-07-17 NOTE — Progress Notes (Signed)
Patient is in office today for a nurse visit for Birth Control Injection. Patient Injection was given in the  Right upper quad. gluteus. Patient tolerated injection well.

## 2024-08-01 ENCOUNTER — Encounter: Payer: Self-pay | Admitting: Diagnostic Neuroimaging

## 2024-08-01 ENCOUNTER — Other Ambulatory Visit: Payer: Self-pay | Admitting: *Deleted

## 2024-08-01 DIAGNOSIS — G35 Multiple sclerosis: Secondary | ICD-10-CM

## 2024-08-08 ENCOUNTER — Other Ambulatory Visit: Payer: Self-pay | Admitting: Diagnostic Neuroimaging

## 2024-09-04 ENCOUNTER — Encounter: Payer: Self-pay | Admitting: Family Medicine

## 2024-09-05 ENCOUNTER — Other Ambulatory Visit: Payer: Self-pay

## 2024-09-05 DIAGNOSIS — I10 Essential (primary) hypertension: Secondary | ICD-10-CM

## 2024-09-05 MED ORDER — AMLODIPINE BESYLATE 5 MG PO TABS: 5.0000 mg | ORAL_TABLET | Freq: Every day | ORAL | 0 refills | Status: DC

## 2024-10-09 ENCOUNTER — Other Ambulatory Visit

## 2024-10-09 ENCOUNTER — Ambulatory Visit: Payer: Self-pay | Admitting: *Deleted

## 2024-10-09 DIAGNOSIS — Z3042 Encounter for surveillance of injectable contraceptive: Secondary | ICD-10-CM

## 2024-10-09 NOTE — Progress Notes (Unsigned)
 Patient is in office today for a nurse visit for Birth Control Injection. Injection was given in the  Left upper quad. gluteus. Patient tolerated injection well.

## 2024-10-12 LAB — IMMUNOGLOBULINS A/E/G/M, SERUM
IgE (Immunoglobulin E), Serum: 3 [IU]/mL — AB (ref 6–495)
IgG (Immunoglobin G), Serum: 594 mg/dL (ref 586–1602)
IgM (Immunoglobulin M), Srm: 43 mg/dL (ref 26–217)
Immunoglobulin A, (IgA) QN, Serum: 72 mg/dL — ABNORMAL LOW (ref 87–352)

## 2024-10-12 LAB — COMPREHENSIVE METABOLIC PANEL WITH GFR
ALT: 39 IU/L — ABNORMAL HIGH (ref 0–32)
AST: 18 IU/L (ref 0–40)
Albumin: 4.3 g/dL (ref 4.0–5.0)
Alkaline Phosphatase: 184 IU/L — ABNORMAL HIGH (ref 41–116)
BUN/Creatinine Ratio: 9 (ref 9–23)
BUN: 5 mg/dL — ABNORMAL LOW (ref 6–20)
Bilirubin Total: 0.7 mg/dL (ref 0.0–1.2)
CO2: 21 mmol/L (ref 20–29)
Calcium: 9.4 mg/dL (ref 8.7–10.2)
Chloride: 106 mmol/L (ref 96–106)
Creatinine, Ser: 0.55 mg/dL — ABNORMAL LOW (ref 0.57–1.00)
Globulin, Total: 2.2 g/dL (ref 1.5–4.5)
Glucose: 83 mg/dL (ref 70–99)
Potassium: 4 mmol/L (ref 3.5–5.2)
Sodium: 141 mmol/L (ref 134–144)
Total Protein: 6.5 g/dL (ref 6.0–8.5)
eGFR: 132 mL/min/1.73 (ref >59)

## 2024-10-12 LAB — CBC WITH DIFFERENTIAL/PLATELET
Basophils Absolute: 0 x10E3/uL (ref 0.0–0.2)
Basos: 0 %
EOS (ABSOLUTE): 0.2 x10E3/uL (ref 0.0–0.4)
Eos: 2 %
Hematocrit: 45.5 % (ref 34.0–46.6)
Hemoglobin: 15.3 g/dL (ref 11.1–15.9)
Immature Grans (Abs): 0.1 x10E3/uL (ref 0.0–0.1)
Immature Granulocytes: 1 %
Lymphocytes Absolute: 1.7 x10E3/uL (ref 0.7–3.1)
Lymphs: 16 %
MCH: 29.5 pg (ref 26.6–33.0)
MCHC: 33.6 g/dL (ref 31.5–35.7)
MCV: 88 fL (ref 79–97)
Monocytes Absolute: 0.7 x10E3/uL (ref 0.1–0.9)
Monocytes: 7 %
Neutrophils Absolute: 8.1 x10E3/uL — ABNORMAL HIGH (ref 1.4–7.0)
Neutrophils: 74 %
Platelets: 364 x10E3/uL (ref 150–450)
RBC: 5.19 x10E6/uL (ref 3.77–5.28)
RDW: 13 % (ref 11.7–15.4)
WBC: 10.9 x10E3/uL — ABNORMAL HIGH (ref 3.4–10.8)

## 2024-10-14 ENCOUNTER — Encounter: Payer: Self-pay | Admitting: Family Medicine

## 2024-11-10 ENCOUNTER — Ambulatory Visit (INDEPENDENT_AMBULATORY_CARE_PROVIDER_SITE_OTHER): Payer: Self-pay | Admitting: Family Medicine

## 2024-11-10 ENCOUNTER — Encounter: Payer: Self-pay | Admitting: Family Medicine

## 2024-11-10 VITALS — BP 127/85 | HR 100 | Temp 97.9°F | Ht 66.0 in | Wt 282.1 lb

## 2024-11-10 DIAGNOSIS — Z0000 Encounter for general adult medical examination without abnormal findings: Secondary | ICD-10-CM

## 2024-11-10 DIAGNOSIS — R4589 Other symptoms and signs involving emotional state: Secondary | ICD-10-CM

## 2024-11-10 DIAGNOSIS — Z793 Long term (current) use of hormonal contraceptives: Secondary | ICD-10-CM

## 2024-11-10 DIAGNOSIS — G35D Multiple sclerosis, unspecified: Secondary | ICD-10-CM

## 2024-11-10 DIAGNOSIS — G5793 Unspecified mononeuropathy of bilateral lower limbs: Secondary | ICD-10-CM

## 2024-11-10 DIAGNOSIS — K219 Gastro-esophageal reflux disease without esophagitis: Secondary | ICD-10-CM

## 2024-11-10 DIAGNOSIS — F411 Generalized anxiety disorder: Secondary | ICD-10-CM | POA: Diagnosis not present

## 2024-11-10 DIAGNOSIS — I10 Essential (primary) hypertension: Secondary | ICD-10-CM

## 2024-11-10 DIAGNOSIS — N946 Dysmenorrhea, unspecified: Secondary | ICD-10-CM | POA: Diagnosis not present

## 2024-11-10 DIAGNOSIS — F4010 Social phobia, unspecified: Secondary | ICD-10-CM

## 2024-11-10 DIAGNOSIS — F32A Depression, unspecified: Secondary | ICD-10-CM | POA: Diagnosis not present

## 2024-11-10 LAB — LIPID PANEL

## 2024-11-10 MED ORDER — GABAPENTIN 600 MG PO TABS: 600.0000 mg | ORAL_TABLET | Freq: Three times a day (TID) | ORAL | 4 refills | Status: AC

## 2024-11-10 MED ORDER — HYDROXYZINE HCL 25 MG PO TABS: 25.0000 mg | ORAL_TABLET | Freq: Three times a day (TID) | ORAL | 4 refills | Status: AC | PRN

## 2024-11-10 MED ORDER — OMEPRAZOLE 20 MG PO CPDR: DELAYED_RELEASE_CAPSULE | ORAL | 4 refills | Status: AC

## 2024-11-10 MED ORDER — MEDROXYPROGESTERONE ACETATE 150 MG/ML IM SUSP: 150.0000 mg | INTRAMUSCULAR | 3 refills | Status: AC

## 2024-11-10 MED ORDER — AMLODIPINE BESYLATE 5 MG PO TABS: 5.0000 mg | ORAL_TABLET | Freq: Every day | ORAL | 4 refills | Status: AC

## 2024-11-10 MED ORDER — BUSPIRONE HCL 15 MG PO TABS: 15.0000 mg | ORAL_TABLET | Freq: Three times a day (TID) | ORAL | 3 refills | Status: AC

## 2024-11-10 MED ORDER — CITALOPRAM HYDROBROMIDE 40 MG PO TABS: 40.0000 mg | ORAL_TABLET | Freq: Every day | ORAL | 4 refills | Status: AC

## 2024-11-10 NOTE — Progress Notes (Signed)
 "  Debra Copeland is a 24 y.o. female who is accompanied today by her mother.  She presents to office today for annual physical exam examination.    Patient reports she had a panic attack yesterday.  She had fairly well-controlled anxiety and depression for a while but came off her BuSpar  a while back because she did not feel like she needed anymore.  She is worried that maybe she is becoming intolerant to her Celexa .  Denies any recent alcohol use, which has precipitated anxiety and panic attacks in the past.  She thinks that maybe she just started thinking about things that happened last year and she started ruminating and spiraling.  She is not currently seeing any therapist and no longer seeing a psychiatrist.   Occupation: Does not work, Marital status: Single, Substance use: None Health Maintenance Due  Topic Date Due   Pneumococcal Vaccine (1 of 2 - PPSV23, PCV20, or PCV21) 06/07/2002   Meningococcal B Vaccine (1 of 2 - Standard) Never done   Cervical Cancer Screening (Pap smear)  Never done   Influenza Vaccine  Never done    Immunization History  Administered Date(s) Administered   DTaP 02/24/2001, 04/25/2001, 06/28/2001, 04/12/2002, 12/29/2005   HIB (PRP-OMP) 02/24/2001, 04/25/2001, 06/28/2001, 04/12/2002   HIB, Unspecified 02/24/2001, 04/25/2001, 06/28/2001, 04/12/2002   HPV Quadrivalent 07/31/2015, 10/01/2015, 01/29/2016   Hepatitis A, Ped/Adol-2 Dose 07/31/2015, 01/29/2016   Hepatitis B, PED/ADOLESCENT 2001/09/30, 02/03/2001, 06/28/2001   IPV 02/24/2001, 04/25/2001, 06/28/2001, 12/29/2005   MMR 12/29/2001, 12/29/2005   Meningococcal Conjugate 07/31/2015, 12/23/2016   Pneumococcal Conjugate-13 02/24/2001, 04/25/2001, 06/28/2001, 04/12/2002   Tdap 07/31/2015   Varicella 12/29/2001, 07/31/2015   Past Medical History:  Diagnosis Date   Anxiety    Depression    Multiple sclerosis    Social History   Socioeconomic History   Marital status: Single    Spouse name: Not  on file   Number of children: 0   Years of education: Not on file   Highest education level: Not on file  Occupational History   Not on file  Tobacco Use   Smoking status: Never   Smokeless tobacco: Never   Tobacco comments:    Pt states vapes with nicotine   Vaping Use   Vaping status: Every Day  Substance and Sexual Activity   Alcohol use: Yes    Alcohol/week: 7.0 standard drinks of alcohol    Types: 7 Shots of liquor per week    Comment: occ   Drug use: Never   Sexual activity: Not Currently    Birth control/protection: Pill, Injection  Other Topics Concern   Not on file  Social History Narrative   Lives with mom dad and brother   Left Handed   Drinks 2-3 cups caffeine daily   Pt not working    Social Drivers of Health   Tobacco Use: Low Risk (07/03/2024)   Patient History    Smoking Tobacco Use: Never    Smokeless Tobacco Use: Never    Passive Exposure: Not on file  Financial Resource Strain: Not on file  Food Insecurity: Not on file  Transportation Needs: Not on file  Physical Activity: Not on file  Stress: Not on file  Social Connections: Not on file  Intimate Partner Violence: Not on file  Depression (PHQ2-9): Low Risk (03/31/2024)   Depression (PHQ2-9)    PHQ-2 Score: 1  Alcohol Screen: Medium Risk (12/21/2023)   Alcohol Screen    Last Alcohol Screening Score (AUDIT): 9  Housing:  Not on file  Utilities: Not on file  Health Literacy: Not on file   No past surgical history on file. Family History  Problem Relation Age of Onset   Hypertension Father    Hypothyroidism Father    Vitamin D  deficiency Father    Bipolar disorder Maternal Uncle    Bipolar disorder Maternal Grandfather    Depression Paternal Grandmother    Multiple sclerosis Neg Hx    Current Medications[1]  Allergies[2]   ROS: Review of Systems Pertinent items noted in HPI and remainder of comprehensive ROS otherwise negative.    Physical exam BP 127/85   Pulse 100   Temp 97.9 F  (36.6 C)   Ht 5' 6 (1.676 m)   Wt 282 lb 2 oz (128 kg)   SpO2 94%   BMI 45.54 kg/m  General appearance: alert, cooperative, appears stated age, no distress, and morbidly obese Head: Normocephalic, without obvious abnormality, atraumatic Eyes: negative findings: lids and lashes normal, conjunctivae and sclerae normal, corneas clear, and pupils equal, round, reactive to light and accomodation Ears: normal TM's and external ear canals both ears Nose: Nares normal. Septum midline. Mucosa normal. No drainage or sinus tenderness. Throat: lips, mucosa, and tongue normal; teeth and gums normal Neck: no adenopathy, no carotid bruit, supple, symmetrical, trachea midline, and thyroid not enlarged, symmetric, no tenderness/mass/nodules Breast: Pendulous.  No axillary lymphadenopathy.  No dominant breast mass.  She does have a few fibroadenomas that which are best appreciated near the areola.  None of these are larger than kidney being size. Back: symmetric, no curvature. ROM normal. No CVA tenderness. Lungs: clear to auscultation bilaterally Heart: regular rate and rhythm, S1, S2 normal, no murmur, click, rub or gallop Abdomen: soft, non-tender; bowel sounds normal; no masses,  no organomegaly Extremities: extremities normal, atraumatic, no cyanosis or edema Pulses: 2+ and symmetric Skin: Skin color, texture, turgor normal. No rashes or lesions Lymph nodes: No supraclavicular or anterior cervical lymph node enlargement Neurologic: Alert and oriented X 3, normal strength and tone. Normal symmetric reflexes. Normal coordination and gait  Psych: Mood labile.  Intermittently tearful.     11/10/2024    3:39 PM 03/31/2024    1:03 PM 12/21/2023    1:30 PM  Depression screen PHQ 2/9  Decreased Interest 1 1 1   Down, Depressed, Hopeless 0 0 2  PHQ - 2 Score 1 1 3   Altered sleeping 1  3  Tired, decreased energy 1  3  Change in appetite 1  3  Feeling bad or failure about yourself  0  0  Trouble  concentrating 1  3  Moving slowly or fidgety/restless 0  0  Suicidal thoughts 0  0  PHQ-9 Score 5  15   Difficult doing work/chores Somewhat difficult  Somewhat difficult     Data saved with a previous flowsheet row definition      11/10/2024    3:39 PM 07/03/2024    1:24 PM 03/31/2024    1:04 PM 02/07/2024    2:03 PM  GAD 7 : Generalized Anxiety Score  Nervous, Anxious, on Edge 2 0 2 3  Control/stop worrying 2 0 1 3  Worry too much - different things 1 1 1 3   Trouble relaxing 2 1 2 3   Restless 1 2 2 2   Easily annoyed or irritable 0 2 1 1   Afraid - awful might happen 1 0 1 1  Total GAD 7 Score 9 6 10 16   Anxiety Difficulty Extremely difficult Somewhat  difficult Somewhat difficult Extremely difficult    Recent Results (from the past 2160 hours)  Immunoglobulins, QN, A/E/G/M     Status: Abnormal   Collection Time: 10/09/24  3:04 PM  Result Value Ref Range   IgG (Immunoglobin G), Serum 594 586 - 1,602 mg/dL   Immunoglobulin A, (IgA) QN, Serum 72 (L) 87 - 352 mg/dL   IgM (Immunoglobulin M), Srm 43 26 - 217 mg/dL   IgE (Immunoglobulin E), Serum 3 (L) 6 - 495 IU/mL  Comprehensive metabolic panel with GFR     Status: Abnormal   Collection Time: 10/09/24  3:04 PM  Result Value Ref Range   Glucose 83 70 - 99 mg/dL   BUN 5 (L) 6 - 20 mg/dL   Creatinine, Ser 9.44 (L) 0.57 - 1.00 mg/dL   eGFR 867 >40 fO/fpw/8.26   BUN/Creatinine Ratio 9 9 - 23   Sodium 141 134 - 144 mmol/L   Potassium 4.0 3.5 - 5.2 mmol/L   Chloride 106 96 - 106 mmol/L   CO2 21 20 - 29 mmol/L   Calcium 9.4 8.7 - 10.2 mg/dL   Total Protein 6.5 6.0 - 8.5 g/dL   Albumin 4.3 4.0 - 5.0 g/dL   Globulin, Total 2.2 1.5 - 4.5 g/dL   Bilirubin Total 0.7 0.0 - 1.2 mg/dL   Alkaline Phosphatase 184 (H) 41 - 116 IU/L   AST 18 0 - 40 IU/L   ALT 39 (H) 0 - 32 IU/L  CBC with Differential/Platelet     Status: Abnormal   Collection Time: 10/09/24  3:04 PM  Result Value Ref Range   WBC 10.9 (H) 3.4 - 10.8 x10E3/uL   RBC 5.19  3.77 - 5.28 x10E6/uL   Hemoglobin 15.3 11.1 - 15.9 g/dL   Hematocrit 54.4 65.9 - 46.6 %   MCV 88 79 - 97 fL   MCH 29.5 26.6 - 33.0 pg   MCHC 33.6 31.5 - 35.7 g/dL   RDW 86.9 88.2 - 84.5 %   Platelets 364 150 - 450 x10E3/uL   Neutrophils 74 Not Estab. %   Lymphs 16 Not Estab. %   Monocytes 7 Not Estab. %   Eos 2 Not Estab. %   Basos 0 Not Estab. %   Neutrophils Absolute 8.1 (H) 1.4 - 7.0 x10E3/uL   Lymphocytes Absolute 1.7 0.7 - 3.1 x10E3/uL   Monocytes Absolute 0.7 0.1 - 0.9 x10E3/uL   EOS (ABSOLUTE) 0.2 0.0 - 0.4 x10E3/uL   Basophils Absolute 0.0 0.0 - 0.2 x10E3/uL   Immature Granulocytes 1 Not Estab. %   Immature Grans (Abs) 0.1 0.0 - 0.1 x10E3/uL     Assessment/ Plan: Debra Copeland here for annual physical exam.   Annual physical exam  Multiple sclerosis - Plan: CMP14+EGFR, CBC with Differential  Neuropathic pain of both feet - Plan: gabapentin  (NEURONTIN ) 600 MG tablet, CMP14+EGFR, CBC with Differential, Vitamin B12  Essential hypertension - Plan: amLODipine  (NORVASC ) 5 MG tablet, CMP14+EGFR, VITAMIN D  25 Hydroxy (Vit-D Deficiency, Fractures)  Morbid obesity (HCC) - Plan: CMP14+EGFR, Bayer DCA Hb A1c Waived, Lipid Panel  Dysmenorrhea treated with oral contraceptive - Plan: medroxyPROGESTERone  (DEPO-PROVERA ) 150 MG/ML injection, CMP14+EGFR, CBC with Differential  Generalized anxiety disorder - Plan: citalopram  (CELEXA ) 40 MG tablet, gabapentin  (NEURONTIN ) 600 MG tablet, hydrOXYzine  (ATARAX ) 25 MG tablet, CMP14+EGFR, busPIRone  (BUSPAR ) 15 MG tablet  Anxiety about health - Plan: citalopram  (CELEXA ) 40 MG tablet, hydrOXYzine  (ATARAX ) 25 MG tablet, CMP14+EGFR  Depressive disorder - Plan: citalopram  (CELEXA ) 40 MG tablet, CMP14+EGFR,  VITAMIN D  25 Hydroxy (Vit-D Deficiency, Fractures)  Social phobia - Plan: CMP14+EGFR, busPIRone  (BUSPAR ) 15 MG tablet  Gastroesophageal reflux disease without esophagitis - Plan: omeprazole  (PRILOSEC) 20 MG capsule, CMP14+EGFR, CBC with  Differential   Nonfasting labs collected.  Resume use of BuSpar .  Atarax  and Celexa  renewed.  Discussed consideration for psychiatric referral versus integrated behavioral health for counseling services.  She would like to hold off on this for the next 4 weeks while she gets back on BuSpar  to determine if this is needed.  She will continue to follow-up with specialist for MS.  Overdue for imaging.  Discussed maybe adding benzodiazepine temporarily for MRI.  She will let me know if this is needed.  OCP renewed.  Offered cervical cancer screening but given never having had intercourse she was reluctant to do 2 concerns for trauma to the pelvis.  We discussed risk versus benefits today  PPI renewed.  No red flag signs or symptoms.  Counseled on healthy lifestyle choices, including diet (rich in fruits, vegetables and lean meats and low in salt and simple carbohydrates) and exercise (at least 30 minutes of moderate physical activity daily).  Patient to follow up 4 weeks virtually or in person for follow-up on mood disorder, sooner if concerns arise  Aliya Sol M. Adekunle Rohrbach, DO        [1]  Current Outpatient Medications:    Alpha-Lipoic Acid 600 MG CAPS, Take 1 capsule (600 mg total) by mouth daily. For neuropathy, Disp: 90 capsule, Rfl: 1   amLODipine  (NORVASC ) 5 MG tablet, Take 1 tablet (5 mg total) by mouth daily., Disp: 90 tablet, Rfl: 0   busPIRone  (BUSPAR ) 15 MG tablet, Take 1 tablet (15 mg total) by mouth 3 (three) times daily., Disp: 270 tablet, Rfl: 3   citalopram  (CELEXA ) 40 MG tablet, Take 1 tablet (40 mg total) by mouth daily., Disp: 90 tablet, Rfl: 3   gabapentin  (NEURONTIN ) 600 MG tablet, Take 1 tablet (600 mg total) by mouth 3 (three) times daily., Disp: 270 tablet, Rfl: 3   hydrOXYzine  (ATARAX ) 25 MG tablet, Take 1-2 tablets (25-50 mg total) by mouth 3 (three) times daily as needed., Disp: 90 tablet, Rfl: 3   KESIMPTA  20 MG/0.4ML SOAJ, INJECT 20 MG INTO THE SKIN EVERY 30 (THIRTY)  DAYS., Disp: 1.2 mL, Rfl: 3   medroxyPROGESTERone  (DEPO-PROVERA ) 150 MG/ML injection, Inject 1 mL (150 mg total) into the muscle every 3 (three) months. To replace OCPs, Disp: 1 mL, Rfl: 3   Multiple Vitamins-Minerals (ONE-A-DAY WOMENS PO), Take by mouth., Disp: , Rfl:    omeprazole  (PRILOSEC) 20 MG capsule, TAKE 1 CAPSULE BY MOUTH ONCE DAILY FOR ACID REFLUX, Disp: 90 capsule, Rfl: 3   ondansetron  (ZOFRAN -ODT) 4 MG disintegrating tablet, Take 1 tablet (4 mg total) by mouth every 8 (eight) hours as needed for nausea or vomiting., Disp: 10 tablet, Rfl: 0  Current Facility-Administered Medications:    medroxyPROGESTERone  (DEPO-PROVERA ) injection 150 mg, 150 mg, Intramuscular, Q90 days, Samadhi Mahurin M, DO, 150 mg at 07/17/24 1539 [2] No Known Allergies  "

## 2024-11-10 NOTE — Patient Instructions (Signed)
Panic Attack A panic attack is when you suddenly feel very afraid, uncomfortable, or nervous (anxious). A panic attack can happen when you are scared, or it may happen for no reason. A panic attack can feel like a heart attack or stroke. See your doctor when you have a panic attack to make sure you are not having a heart attack or stroke. What are the causes? Experiencing things that threaten your life, such as a war. Feeling worried or nervous for a long time (anxiety disorder). Being sad (depressed). Panic disorder. Certain medical conditions. Other causes may include: Certain medicines. Taking certain supplements. Illegal drugs. What increases the risk? Having another mental health condition. Using alcohol or drugs. Being under a lot of stress. Having events in your life that cause worry and sadness. What are the signs or symptoms? A panic attack: Starts suddenly. May last 5-10 minutes. Symptoms include one or more of these: A pounding heart. A feeling that your heart is beating in an unusual way or faster than normal (palpitations). Sweating or shaking. Feeling short of breath. Chest pain. Feeling like you may vomit (nauseous). Feeling dizzy or like you might faint. Other symptoms may include: Chills or hot flashes. Numbness or tingling in your lips, hands, or feet. Feeling confused. Fear of losing control. Fear of dying. How is this treated? A panic attack is a symptom of another condition. Treatment depends on the cause of the panic attack. If the cause is a medical problem, your doctor will treat that problem or refer you to a specialist. If the cause is emotional, you may be given medicines or referred to a counselor. If the cause is a medicine, your doctor may tell you to stop the medicine, change your dose, or take a different medicine. If the cause is an illegal drug, treatment may involve letting the drug wear off and taking medicine to help the drug leave your  body or to stop its effects. Attacks caused by heavy drug use may continue even if you stop using the drug. Most panic attacks go away after the cause is treated. Follow these instructions at home: Alcohol use Do not drink alcohol if: Your doctor tells you not to drink. You are pregnant, may be pregnant, or are planning to become pregnant. If you drink alcohol: Limit how much you have to: 0-1 drink a day for women. 0-2 drinks a day for men. Know how much alcohol is in your drink. In the [place] one drink equals one 12 oz bottle of beer (355 mL), one 5 oz glass of wine (148 mL), or one 1 oz glass of hard liquor (44 mL). General instructions  Take over-the-counter and prescription medicines only as told by your doctor. If you feel worried or nervous, try not to have caffeine. Take good care of your health. To do this: Eat healthy. Make sure to eat fresh fruits and vegetables, whole grains, lean meats, and low-fat dairy. Get enough sleep. Try to sleep for 7-8 hours each night. Exercise. Try to be active for 30 minutes 5 or more days a week. Do not smoke or use any products that contain nicotine or tobacco. If you need help quitting, ask your doctor. Keep all follow-up visits. Where to find more information Substance Abuse and Mental Health Services Administration (SAMHSA): samhsa.gov National Institute of Mental Health (NIMH): www.nimh.nih.gov Contact a doctor if: Your symptoms do not get better. Your symptoms get worse. You are not able to take your medicines as told. Get help   right away if: You have thoughts of hurting yourself or others. Get help right away if you feel like you may hurt yourself or others, or have thoughts about taking your own life. Go to your nearest emergency room or: Call 911. Call the National Suicide Prevention Lifeline at 838-231-2849 or 988. This is open 24 hours a day. Text the Crisis Text Line at (367)812-7096. Summary A panic attack is when you suddenly feel  very afraid, uncomfortable, or nervous (anxious). See your doctor when you have a panic attack to make sure that you do not have another serious problem. If you feel like you may hurt yourself or others, get help right away. Call 911. This information is not intended to replace advice given to you by your health care provider. Make sure you discuss any questions you have with your health care provider. Document Revised: 06/05/2021 Document Reviewed: 06/05/2021 Elsevier Patient Education  2024 [employer].

## 2024-11-11 LAB — LIPID PANEL
Cholesterol, Total: 198 mg/dL (ref 100–199)
HDL: 45 mg/dL
LDL CALC COMMENT:: 4.4 ratio (ref 0.0–4.4)
LDL Chol Calc (NIH): 118 mg/dL — AB (ref 0–99)
Triglycerides: 200 mg/dL — AB (ref 0–149)
VLDL Cholesterol Cal: 35 mg/dL (ref 5–40)

## 2024-11-11 LAB — CBC WITH DIFFERENTIAL/PLATELET
Basophils Absolute: 0.1 x10E3/uL (ref 0.0–0.2)
Basos: 1 %
EOS (ABSOLUTE): 0.2 x10E3/uL (ref 0.0–0.4)
Eos: 2 %
Hematocrit: 49 % — ABNORMAL HIGH (ref 34.0–46.6)
Hemoglobin: 16.2 g/dL — ABNORMAL HIGH (ref 11.1–15.9)
Immature Grans (Abs): 0.1 x10E3/uL (ref 0.0–0.1)
Immature Granulocytes: 1 %
Lymphocytes Absolute: 1.4 x10E3/uL (ref 0.7–3.1)
Lymphs: 14 %
MCH: 29.7 pg (ref 26.6–33.0)
MCHC: 33.1 g/dL (ref 31.5–35.7)
MCV: 90 fL (ref 79–97)
Monocytes Absolute: 0.8 x10E3/uL (ref 0.1–0.9)
Monocytes: 8 %
Neutrophils Absolute: 8.1 x10E3/uL — ABNORMAL HIGH (ref 1.4–7.0)
Neutrophils: 74 %
Platelets: 288 x10E3/uL (ref 150–450)
RBC: 5.46 x10E6/uL — ABNORMAL HIGH (ref 3.77–5.28)
RDW: 13.6 % (ref 11.7–15.4)
WBC: 10.7 x10E3/uL (ref 3.4–10.8)

## 2024-11-11 LAB — CMP14+EGFR
ALT: 53 IU/L — AB (ref 0–32)
AST: 40 IU/L (ref 0–40)
Albumin: 4.5 g/dL (ref 4.0–5.0)
Alkaline Phosphatase: 164 IU/L — AB (ref 41–116)
BUN/Creatinine Ratio: 12 (ref 9–23)
BUN: 7 mg/dL (ref 6–20)
Bilirubin Total: 0.6 mg/dL (ref 0.0–1.2)
CO2: 22 mmol/L (ref 20–29)
Calcium: 9.1 mg/dL (ref 8.7–10.2)
Chloride: 107 mmol/L — AB (ref 96–106)
Creatinine, Ser: 0.59 mg/dL (ref 0.57–1.00)
Globulin, Total: 2 g/dL (ref 1.5–4.5)
Glucose: 111 mg/dL — AB (ref 70–99)
Potassium: 3.9 mmol/L (ref 3.5–5.2)
Sodium: 143 mmol/L (ref 134–144)
Total Protein: 6.5 g/dL (ref 6.0–8.5)
eGFR: 130 mL/min/1.73

## 2024-11-11 LAB — VITAMIN D 25 HYDROXY (VIT D DEFICIENCY, FRACTURES): Vit D, 25-Hydroxy: 21.8 ng/mL — AB (ref 30.0–100.0)

## 2024-11-11 LAB — VITAMIN B12: Vitamin B-12: 577 pg/mL (ref 232–1245)

## 2024-11-13 LAB — BAYER DCA HB A1C WAIVED: HB A1C (BAYER DCA - WAIVED): 4.9 % (ref 4.8–5.6)

## 2024-11-14 ENCOUNTER — Ambulatory Visit: Payer: Self-pay | Admitting: Family Medicine

## 2024-11-14 ENCOUNTER — Encounter: Payer: Self-pay | Admitting: Family Medicine

## 2024-11-14 ENCOUNTER — Ambulatory Visit: Payer: Self-pay | Admitting: Diagnostic Neuroimaging

## 2024-11-20 ENCOUNTER — Telehealth: Payer: Self-pay

## 2024-11-20 ENCOUNTER — Other Ambulatory Visit (HOSPITAL_COMMUNITY): Payer: Self-pay

## 2024-11-20 DIAGNOSIS — G35A Relapsing-remitting multiple sclerosis: Secondary | ICD-10-CM

## 2024-11-20 NOTE — Telephone Encounter (Signed)
 Hello Prescriber!  We are in the process of submitting a prior authorization for your patient for Kesimpta . We are reaching out for clinical guidance for the following information to complete the request: Plan requires documentation detailing the patient's type of MS: Relapsing Remitting or the Secondary Progressive    Thank you! Pharmacy Team

## 2024-11-21 NOTE — Telephone Encounter (Signed)
 Relapsing remitting multiple sclerosis [G35.A]  Debra FABIENE HANLON, MD 11/21/2024, 5:40 PM Certified in Neurology, Neurophysiology and Neuroimaging  [facility] [address] [place], [place] [address] [phone]

## 2024-11-24 ENCOUNTER — Other Ambulatory Visit (HOSPITAL_COMMUNITY): Payer: Self-pay

## 2024-11-24 ENCOUNTER — Telehealth: Payer: Self-pay

## 2024-11-24 NOTE — Telephone Encounter (Signed)
 Pharmacy Patient Advocate Encounter   Received notification from Pt Calls Messages that prior authorization for Kesimpta  20MG /0.4ML auto-injectors is required/requested.   Insurance verification completed.   The patient is insured through [facility].   Per test claim: PA required; PA submitted to above mentioned insurance via Latent Key/confirmation #/EOC [facility] Status is pending

## 2024-11-28 ENCOUNTER — Other Ambulatory Visit (HOSPITAL_COMMUNITY): Payer: Self-pay

## 2024-11-28 NOTE — Telephone Encounter (Signed)
 Pharmacy Patient Advocate Encounter  Received notification from [facility] that Prior Authorization for  Kesimpta  20MG /0.4ML auto-injectors has been APPROVED from 11-24-2024 to 11-24-2025  Must fill at [facility]  PA #/Case ID/Reference #: [id number]

## 2024-12-12 ENCOUNTER — Encounter: Payer: Self-pay | Admitting: Family Medicine

## 2024-12-12 ENCOUNTER — Telehealth: Admitting: Family Medicine

## 2024-12-12 DIAGNOSIS — R4589 Other symptoms and signs involving emotional state: Secondary | ICD-10-CM

## 2024-12-12 DIAGNOSIS — F32A Depression, unspecified: Secondary | ICD-10-CM | POA: Diagnosis not present

## 2024-12-12 DIAGNOSIS — F411 Generalized anxiety disorder: Secondary | ICD-10-CM

## 2024-12-12 DIAGNOSIS — F4010 Social phobia, unspecified: Secondary | ICD-10-CM

## 2025-01-01 ENCOUNTER — Ambulatory Visit

## 2025-03-14 ENCOUNTER — Ambulatory Visit: Admitting: Family Medicine
